# Patient Record
Sex: Female | Born: 1975 | Hispanic: Yes | Marital: Single | Smoking: Never smoker
Health system: Southern US, Community
[De-identification: ages and names within clinical notes are randomized; demographics above are authoritative.]

## PROBLEM LIST (undated history)

## (undated) ENCOUNTER — Inpatient Hospital Stay (HOSPITAL_COMMUNITY): Payer: Self-pay

## (undated) DIAGNOSIS — D649 Anemia, unspecified: Secondary | ICD-10-CM

## (undated) DIAGNOSIS — O09519 Supervision of elderly primigravida, unspecified trimester: Secondary | ICD-10-CM

## (undated) DIAGNOSIS — A749 Chlamydial infection, unspecified: Secondary | ICD-10-CM

## (undated) DIAGNOSIS — E669 Obesity, unspecified: Secondary | ICD-10-CM

## (undated) DIAGNOSIS — I8390 Asymptomatic varicose veins of unspecified lower extremity: Secondary | ICD-10-CM

## (undated) HISTORY — PX: NO PAST SURGERIES: SHX2092

---

## 2016-12-13 LAB — OB RESULTS CONSOLE HIV ANTIBODY (ROUTINE TESTING): HIV: NONREACTIVE

## 2016-12-13 LAB — OB RESULTS CONSOLE RPR: RPR: NONREACTIVE

## 2016-12-13 LAB — OB RESULTS CONSOLE ANTIBODY SCREEN: ANTIBODY SCREEN: NEGATIVE

## 2016-12-13 LAB — OB RESULTS CONSOLE ABO/RH: RH Type: POSITIVE

## 2016-12-13 LAB — OB RESULTS CONSOLE GBS: STREP GROUP B AG: POSITIVE

## 2016-12-13 LAB — OB RESULTS CONSOLE HEPATITIS B SURFACE ANTIGEN: HEP B S AG: NEGATIVE

## 2016-12-13 LAB — OB RESULTS CONSOLE GC/CHLAMYDIA
Chlamydia: POSITIVE
Gonorrhea: NEGATIVE

## 2016-12-13 LAB — OB RESULTS CONSOLE VARICELLA ZOSTER ANTIBODY, IGG: VARICELLA IGG: IMMUNE

## 2016-12-13 LAB — OB RESULTS CONSOLE RUBELLA ANTIBODY, IGM: RUBELLA: IMMUNE

## 2017-04-04 LAB — OB RESULTS CONSOLE GC/CHLAMYDIA
CHLAMYDIA, DNA PROBE: NEGATIVE
Gonorrhea: NEGATIVE

## 2017-04-05 ENCOUNTER — Encounter: Payer: Self-pay | Admitting: Obstetrics and Gynecology

## 2017-04-05 DIAGNOSIS — O329XX0 Maternal care for malpresentation of fetus, unspecified, not applicable or unspecified: Secondary | ICD-10-CM | POA: Insufficient documentation

## 2017-04-05 NOTE — Progress Notes (Signed)
[facility] Note  MD Consult Note Patient is 36/2 weeks and was breech yesterday on bpp. d/w her re: r/b/a, namely ECV and r/b of it. patient and partner undecided; they have an appt in two days and I said they can think about it and let us know. I told them the earliest an ECV would be done is at 37/0 weeks, if that's what they want. if not, then she will need a C-section scheduled for 39/0 weeks.

## 2017-04-18 ENCOUNTER — Encounter (HOSPITAL_COMMUNITY): Payer: Self-pay

## 2017-04-18 NOTE — Pre-Procedure Instructions (Signed)
Interpreter number [phone]

## 2017-04-21 NOTE — Patient Instructions (Signed)
Instrucciones Para Antes de la Ciruga   Su ciruga est programada para 04/24/2017  (your procedure is scheduled on) Entre por la entrada principal del [facility]  a las 0945 de la [place] -(enter through the main entrance at [facility] at  AM    ITT IndustriesLevante el telfono, [place] el [phone] para informarnos de su llegada. (pick up phone, dial [phone] on arrival)     Por favor llame al [phone] si tiene algn problema en la maana de la ciruga (please call this number if you have any problems the morning of surgery.)                  Recuerde: (Remember)  No coma alimentos. (Do not eat food (After Midnight) Desps de medianoche)    No tome lquidos claros. (Do not drink clear liquids (After Midnight) Desps de medianoche)    No use joyas, maquillaje de ojos, lpiz labial, crema para el cuerpo o esmalte de uas oscuro. (Do not wear jewelry, eye makeup, lipstick, body lotion, or dark fingernail polish). Puede usar desodorante (you may wear deodorant)    No se afeite 48 horas de su ciruga. (Do not shave 48 hours before your surgery)    No traiga objetos de valor al hospital.  Ames no se hace responsable de ninguna pertenencia, ni objetos de valor que haya trado al hospital. (Do not bring valuable to the hospital.  [facility] is not responsible for any belongings or valuables brought to the hospital)   [facility] estas medicinas en la maana de la ciruga con un SORBITO de agua nada (take these meds the morning of surgery with a SIP of water)     Durante la ciruga no se pueden usar lentes de contacto, dentaduras o puentes. (Contacts, dentures or bridgework cannot be worn in surgery).   Si va a ser ingresado despus de la ciruga, deje la AMR Corporationmaleta en el carro hasta que se le haya asignado una habitacin. (If you are to be admitted after surgery, leave suitcase in car until your room has been assigned.)   A los pacientes que se les d de alta el  mismo da no se les permitir manejar a casa.  (Patients discharged on the day of surgery will not be allowed to drive home)    French Guianaombre y nmero de telfono del Programmer, multimediaconductor na. (Name and telephone number of your driver)   Instrucciones especiales N/A (Special Instructions)   Por favor, lea las hojas informativas que le entregaron. (Please read over the following fact sheets that you were given) Surgical Site Infection Prevention

## 2017-04-22 ENCOUNTER — Encounter (HOSPITAL_COMMUNITY)
Admission: RE | Admit: 2017-04-22 | Discharge: 2017-04-22 | Disposition: A | Discharge status: Home / Self Care | Payer: Self-pay | Source: Ambulatory Visit | Attending: Obstetrics & Gynecology | Admitting: Obstetrics & Gynecology

## 2017-04-22 HISTORY — DX: Obesity, unspecified: E66.9

## 2017-04-22 HISTORY — DX: Anemia, unspecified: D64.9

## 2017-04-22 HISTORY — DX: Supervision of elderly primigravida, unspecified trimester: O09.519

## 2017-04-22 LAB — CBC
HCT: 34.8 % — ABNORMAL LOW (ref 36.0–46.0)
Hemoglobin: 11.4 g/dL — ABNORMAL LOW (ref 12.0–15.0)
MCH: 27.1 pg (ref 26.0–34.0)
MCHC: 32.8 g/dL (ref 30.0–36.0)
MCV: 82.7 fL (ref 78.0–100.0)
PLATELETS: 141 10*3/uL — AB (ref 150–400)
RBC: 4.21 MIL/uL (ref 3.87–5.11)
RDW: 14.3 % (ref 11.5–15.5)
WBC: 6.2 10*3/uL (ref 4.0–10.5)

## 2017-04-22 LAB — TYPE AND SCREEN
ABO/RH(D): A POS
Antibody Screen: NEGATIVE

## 2017-04-22 LAB — ABO/RH: ABO/RH(D): A POS

## 2017-04-23 LAB — RPR: RPR Ser Ql: NONREACTIVE

## 2017-04-24 ENCOUNTER — Inpatient Hospital Stay (HOSPITAL_COMMUNITY): Payer: Self-pay | Admitting: Anesthesiology

## 2017-04-24 ENCOUNTER — Encounter (HOSPITAL_COMMUNITY)
Admission: AD | Disposition: A | Discharge status: Home / Self Care | Payer: Self-pay | Source: Ambulatory Visit | Attending: Obstetrics & Gynecology

## 2017-04-24 ENCOUNTER — Encounter (HOSPITAL_COMMUNITY): Payer: Self-pay | Admitting: *Deleted

## 2017-04-24 ENCOUNTER — Observation Stay (HOSPITAL_COMMUNITY)
Admission: AD | Admit: 2017-04-24 | Discharge: 2017-04-24 | Disposition: A | Discharge status: Home / Self Care | Payer: Self-pay | Source: Ambulatory Visit | Attending: Obstetrics & Gynecology | Admitting: Obstetrics & Gynecology

## 2017-04-24 ENCOUNTER — Other Ambulatory Visit: Payer: Self-pay

## 2017-04-24 DIAGNOSIS — O329XX0 Maternal care for malpresentation of fetus, unspecified, not applicable or unspecified: Secondary | ICD-10-CM | POA: Diagnosis present

## 2017-04-24 DIAGNOSIS — Z0379 Encounter for other suspected maternal and fetal conditions ruled out: Principal | ICD-10-CM | POA: Insufficient documentation

## 2017-04-24 DIAGNOSIS — Z3A39 39 weeks gestation of pregnancy: Secondary | ICD-10-CM | POA: Insufficient documentation

## 2017-04-24 DIAGNOSIS — O99213 Obesity complicating pregnancy, third trimester: Secondary | ICD-10-CM | POA: Insufficient documentation

## 2017-04-24 SURGERY — Surgical Case
Anesthesia: Spinal

## 2017-04-24 MED ORDER — LACTATED RINGERS IV SOLN
INTRAVENOUS | Status: DC
  Administered 2017-04-24: 10:00:00 via INTRAVENOUS

## 2017-04-24 MED ORDER — SOD CITRATE-CITRIC ACID 500-334 MG/5ML PO SOLN: 30.0000 mL | ORAL | Status: DC

## 2017-04-24 MED ORDER — CEFAZOLIN SODIUM-DEXTROSE 2-4 GM/100ML-% IV SOLN: 2.0000 g | INTRAVENOUS | Status: DC

## 2017-04-24 NOTE — Discharge Instructions (Signed)
Evaluacin de los movimientos fetales Fetal Movement Counts Introduccin Nombre del paciente: ________________________________________________ Janet [name] estimada: ____________________ Janet [name] evaluacin de los movimientos fetales? Una evaluacin de los movimientos fetales es el registro del nmero de veces que siente que el beb se mueve durante un cierto perodo de [name]. Esto tambin se puede denominar recuento de patadas fetales. Una evaluacin de movimientos fetales se recomienda a todas las embarazadas. Es posible que le indiquen que comience a Development worker, community los movimientos fetales desde la semana 28 de [place]. Preste atencin cuando sienta que el beb est ms activo. Podr detectar los ciclos en que el beb duerme y est despierto. Tambin podr detectar que ciertas cosas hacen que su beb se mueva ms. Deber realizar una evaluacin de los movimientos fetales en las siguientes situaciones:  Cuando el beb est ms activo habitualmente.  A la [employer], todos los [place].  Un buen momento para evaluar los movimientos fetales es cuando est descansando, despus de haber comido y bebido algo. Cmo debo contar los movimientos fetales? 1. Encuentre un lugar tranquilo y cmodo. Sintese o acustese de lado. 2. Anote la fecha, la hora de inicio y de finalizacin y la cantidad de movimientos que sinti entre esas dos horas. Lleve esta informacin a las visitas de control. 3. Cuente las pataditas, revoloteos, chasquidos, vueltas o pinchazos en un perodo de 2horas. Debe sentir al menos en 2horas. 4. Cuando sienta , puede dejar de contar. 5. Si no siente en 2horas, coma y beba algo. Luego, contine descansando y Industrial/product designer. Si siente al menos durante esa hora, puede dejar de contar. Comunquese con un mdico si:  Siente menos de en 2horas.  El beb no se mueve tanto como suele hacerlo. Fecha:  ____________ Janet [name] inicio: ____________ Janet [name] finalizacin: ____________ Movimientos: ____________ Janet [name]: ____________ Janet [name] inicio: ____________ Janet [name] finalizacin: ____________ Movimientos: ____________ Janet [name]: ____________ Janet [name] inicio: ____________ Janet [name] finalizacin: ____________ Movimientos: ____________ Janet [name]: ____________ Janet [name] inicio: ____________ Janet [name] finalizacin: ____________ Movimientos: ____________ Janet [name]: ____________ Janet [name] inicio: ____________ Janet [name] de finalizacin: ____________ Movimientos: ____________ Janet [name]: ____________ Janet [name] inicio: ____________ Janet [name] de finalizacin: ____________ Movimientos: ____________ Janet [name]: ____________ Janet [name] inicio: ____________ Janet [name] de finalizacin: ____________ Movimientos: ____________ Janet [name]: ____________ Janet [name] inicio: ____________ Janet [name] finalizacin: ____________ Movimientos: ____________ Janet [name]: ____________ Janet [name] inicio: ____________ Janet [name] de finalizacin: ____________ Movimientos: ____________ Esta informacin no tiene como fin reemplazar el consejo del mdico. Asegrese de hacerle al mdico cualquier pregunta que tenga. Document Released: 05/18/2007 Document Revised: 05/14/2016 Document Reviewed: 03/20/2015 Elsevier Interactive Patient Education  2018 [employer]. Induccin del trabajo de parto (Labor Induction) Se denomina induccin del trabajo de parto cuando se inician acciones para hacer que una mujer embarazada comience el [place] de [place]. La Harley[employer]son de las mujeres comienzan el trabajo de parto sin ayuda entre las semanas 37 y 42 del Psychiatrist. Cuando esto no ocurre o cuando hay una necesidad mdica, pueden utilizarse diferentes mtodos para inducirlo. La induccin del trabajo de parto hace que el tero se contraiga. Tambin hace que el cuello del tero se ablandemadure), se abra (se dilate), y se afine (se borre). Generalmente el trabajo de parto no se induce antes de las 39 semanas excepto que haya un problema con el  beb o con la [place]. Antes de inducir el trabajo de parto, el mdico considerar cierto nmero de factores incluyendo los siguientes:  El estado del beb.  Cuntas semanas tiene de [place].  Theresa Duty  de los pulmones del beb.  El [place] del cuello del tero.  La posicin del beb. CULES SON LOS MOTIVOS PARA INDUCIR UN PARTO? El [place] de parto puede inducirse por las siguientes razones:  La salud del beb o de la madre estn en riesgo.  El embarazo se ha pasado de trmino en 1 semana o ms.  Ha roto la bolsa de aguas pero no se ha iniciado el trabajo de parto por s mismo.  La madre tiene algn trastorno de salud o una enfermedad grave, como hipertensin arterial, una infeccin, desprendimiento abrupto de la placenta o diabetes.  Hay escaso lquido amnitico alrededor del beb.  El beb presenta sufrimiento. La conveniencia o el deseo de que el beb nazca en una cierta fecha no es un motivo para inducir el [place]. CULES SON LOS MTODOS UTILIZADOS PARA INDUCIR EL TRABAJO DE PARTO? Algunos mtodos de induccin del [place]hor Dow son:  Administracin del medicamentos prostaglandina. Este medicamento hace que el cuello uterino se dilate y [place]. Este medicamento tambin iniciar las contracciones. Puede tomarse por boca o insertarse en la vagina en forma de supositorio.  Insercin en la vagina de un tubo delgado (catter) con un baln en el extremo para dilatar el cuello del tero. Una vez insertado, el baln se infla con agua, lo que provoca la apertura del cuello del tero.  Ruptura de las [place]. El mdico separa el saco amnitico del cuello uterino, haciendo que el cuello uterino se distienda y cause la liberacin de la hormona llamada progesterona. Esto hace que el tero se contraiga. Este procedimiento se realiza durante una visita al consultorio mdico. Le indicarn que vuelva a su casa y espere que se inicien las contracciones. Luego tendr que volver para la  induccin.  Ruptura de la bolsa de aguas. El mdico romper el saco amnitico con un pequeo instrumento. Una vez que el saco amnitico se rompe, las Therapist, occupational. Pueden pasar algunas horas hasta que [employer].  Medicamentos que desencadenen o intensifiquen las contracciones. Se lo administrarn a travs de un catter por va intravenosa (IV) que se inserta en una de las venas del brazo. Todos los mtodos de induccin, excepto la ruptura de [place], se Futures trader hospital. La induccin se Games developer hospital, de modo que usted y el beb puedan ser controlados cuidadosamente. CUNTO TIEMPO LLEVA INDUCIR EL TRABAJO DE PARTO? Algunas inducciones pueden demorar entre 2 y [address]. Generalmente lleva Sara Lee, dependiendo del [place] del cuello del tero. Puede tomar ms tiempo si la induccin se realiza en etapas tempranas del Psychiatrist o es su Tree surgeon. Si han pasado 2 o [address] y no se inicia el trabajo de [place], podrn enviarla a su casa o Magazine features editor cesrea. CULES SON LOS RIESGOS ASOCIADOS CON LA INDUCCiN DEL TRABAJO DE PARTO? Algunos de los riesgos de la induccin son:  Cambios en la frecuencia cardaca fetal, por ejemplo los latidos son demasiado rpidos, o lentos, o errticos.  Riesgo de distrs fetal.  Posibilidad de infeccin en la madre o el beb.  Aumento de la posibilidad de que sea necesaria una cesrea.  Ruptura (abrupcin) de la placenta del tero (raro).  Ruptura uterina (muy raro). Cuando es Passenger transport manager la induccin por razones mdicas, los beneficios deben superar a los [place]. CULES SON ALGUNAS RAZONES PARA NO INDUCIR EL TRABAJO DE PARTO? La induccin no debe realizarse si:  Se demuestra que el beb no tolera el trabajo de [place].  Fue sometida anteriormente  a cirugas en el tero, como una miomectoma o le han extirpado fibromas.  La placenta est en una posicin muy baja en el tero y obstruye la abertura del cuello  (placenta previa).  El beb no est ubicado con la Walgreencabeza hacia bajo.  El cordn umbilical cae hacia el canal de parto, adelante del beb. Esto puede cortar el suministro de [place] y oxgeno al beb.  Fue sometida a Higher education careers adviseruna cesrea anteriormente.  Hay circunstancias poco habituales, como que el beb es Doctor, general practiceextremadamente prematuro. Esta informacin no tiene Theme park managercomo fin reemplazar el consejo del mdico. Asegrese de hacerle al mdico cualquier pregunta que tenga. Document Released: 05/18/2007 Document Revised: 06/02/2015 Document Reviewed: 09/07/2012 Elsevier Interactive Patient Education  2017 Arvin[employer]vier [employer].

## 2017-04-24 NOTE — H&P (Addendum)
Obstetric Preoperative History and Physical  Tedra Senegallvira Alvara-Gonzalez is a 42 y.o. G1P0 with IUP at 6864w0d with Estimated Date of Delivery: 05/01/17 by 20-wk U/S presenting for presenting for scheduled cesarean section for fetal malpresentation (breech).  No acute concerns.   Prenatal Course Source of Care: [facility] Pregnancy complications or risks: Patient Active Problem List   Diagnosis Date Noted  . Malpresentation of fetus 04/05/2017   She plans to breastfeed  Prenatal labs and studies: ABO, Rh: --/--/A POS, A POS Performed at [facility], [address] [place], [place] [address]  [phone](03/01 1120) Antibody: NEG (03/01 1120) Rubella: Immune (10/22 0000) RPR: Non Reactive (03/01 1120)  HBsAg: Negative (10/22 0000)  HIV: Non-reactive (10/22 0000)  YQM:VHQIONGEGBS:Positive (10/22 0000) 1 hr Glucola  92 (1st trimester); 128 (3rd trimester) G/CChlamydia: positive in first trimester, neg TOC 02/07/17; negative in third trimester (05/02/17) Genetic screening: too late Anatomy US normal  Prenatal Transfer Tool  Maternal Diabetes: No Genetic Screening: not done (too late) Maternal Ultrasounds/Referrals: Normal Fetal Ultrasounds or other Referrals:  None Maternal Substance Abuse:  No Significant Maternal Medications:  None Significant Maternal Lab Results: Lab values include: Group B Strep positive  Past Medical History:  Diagnosis Date  . AMA (advanced maternal age) primigravida 35+   . Anemia   . Obesity     Past Surgical History:  Procedure Laterality Date  . NO PAST SURGERIES      OB History  Gravida Para Term Preterm AB Living  1            SAB TAB Ectopic Multiple Live Births               # Outcome Date GA Lbr Len/2nd Weight Sex Delivery Anes PTL Lv  1 Current               Social History   Socioeconomic History  . Marital status: Single    Spouse name: None  . Number of children: None  . Years of education: None  . Highest education level: None  Social Needs  .  Financial resource strain: None  . Food insecurity - worry: None  . Food insecurity - inability: None  . Transportation needs - medical: None  . Transportation needs - non-medical: None  Occupational History  . None  Tobacco Use  . Smoking status: Never Smoker  . Smokeless tobacco: Never Used  Substance and Sexual Activity  . Alcohol use: None  . Drug use: None  . Sexual activity: None  Other Topics Concern  . None  Social History Narrative  . None    Family History  Problem Relation Age of Onset  . Hypertension Mother     Medications Prior to Admission  Medication Sig Dispense Refill Last Dose  . Prenatal Vit-Fe Fumarate-FA (PRENATAL PO) Take 1 tablet by mouth daily.       No Known Allergies  Review of Systems: Negative except for what is mentioned in HPI.  Physical Exam: BP (!) 153/76   Pulse 85   Temp 98.2 F (36.8 C) (Oral)   Resp 18   Ht 5' 4.96" (1.65 m)   Wt 261 lb 14.4 oz (118.8 kg)   BMI 43.63 kg/m   CONSTITUTIONAL: Well-developed, well-nourished female in no acute distress.  HENT:  Normocephalic, atraumatic. Oropharynx is clear and moist EYES: Conjunctivae and EOM are normal. No scleral icterus.  NECK: Normal range of motion, supple SKIN: Skin is warm and dry. No rash noted. Not diaphoretic. No erythema.  NEUROLGIC: Alert and oriented to person, place, and time. Normal reflexes, muscle tone coordination. No cranial nerve deficit noted. PSYCHIATRIC: Normal mood and affect. Normal behavior. CARDIOVASCULAR: Normal heart rate noted, regular rhythm RESPIRATORY: Effort and breath sounds normal, no problems with respiration noted ABDOMEN: Soft, nontender, nondistended, gravid.  PELVIC: SVE closed/thick/high, posterior MUSCULOSKELETAL: Normal range of motion. No edema and no tenderness. 2+ distal pulses.  Bedside U/S: cephalic  FHT: baseline rate 130, moderate variability, +acel, no decel  Pertinent Labs/Studies:   Results for orders placed or performed  during the hospital encounter of 04/22/17 (from the past 72 hour(s))  CBC     Status: Abnormal   Collection Time: 04/22/17 11:20 AM  Result Value Ref Range   WBC 6.2 4.0 - 10.5 K/uL   RBC 4.21 3.87 - 5.11 MIL/uL   Hemoglobin 11.4 (L) 12.0 - 15.0 g/dL   HCT 69.6 (L) 29.5 - 28.4 %   MCV 82.7 78.0 - 100.0 fL   MCH 27.1 26.0 - 34.0 pg   MCHC 32.8 30.0 - 36.0 g/dL   RDW 13.2 44.0 - 10.2 %   Platelets 141 (L) 150 - 400 K/uL    Comment: Performed at [facility], [address] [place], [place] [address]  Type and screen [facility]     Status: None   Collection Time: 04/22/17 11:20 AM  Result Value Ref Range   ABO/RH(D) A POS    Antibody Screen NEG    Sample Expiration      04/25/2017 Performed at [facility], [address], [place], [place] [address]   RPR     Status: None   Collection Time: 04/22/17 11:20 AM  Result Value Ref Range   RPR Ser Ql Non Reactive Non Reactive    Comment: (NOTE) Performed At: [facility] [address] [place], [place] [id number] Jolene Schimke MD OV:[id number] Performed at [facility], [address] [place], [place] [address]   ABO/Rh     Status: None   Collection Time: 04/22/17 11:20 AM  Result Value Ref Range   ABO/RH(D)      A POS Performed at [facility], [address], [place], [place] [address]     Assessment and Plan :Latrisa Hellums is a 42 y.o. G1P0 at [redacted]w[redacted]d here for scheduled cesarean section for breech presentation, but presentation today is cephalic. Has reactive NST. Initial BP was elevated, but normal on recheck.  Patient was informed of no longer needing C/S, and was offered option of awaiting onset of labor vs IOL today. SVE as above, unfavorable. Pt decided on going home and awaiting onset of labor. She understands that she may still need IOL for she does not have SOL.   - D/c home in stable condition - Counseled on labor and ROM precautions - Counseled on fetal kick  counts - F/u PNV at HD later this week (pt advised to schedule visit and note forwarded to [facility]).   Raynelle Fanning P. Fidencio Duddy, MD OB Fellow Faculty Practice, [facility]

## 2017-04-24 NOTE — Anesthesia Preprocedure Evaluation (Signed)
Anesthesia Evaluation  Patient identified by MRN, date of birth, ID band Patient awake    Reviewed: Allergy & Precautions, NPO status , Patient's Chart, lab work & pertinent test results  Airway Mallampati: II  TM Distance: >3 FB Neck ROM: Full    Dental no notable dental hx.    Pulmonary neg pulmonary ROS,    Pulmonary exam normal breath sounds clear to auscultation       Cardiovascular negative cardio ROS Normal cardiovascular exam Rhythm:Regular Rate:Normal     Neuro/Psych negative neurological ROS  negative psych ROS   GI/Hepatic negative GI ROS, Neg liver ROS,   Endo/Other  Morbid obesity  Renal/GU negative Renal ROS     Musculoskeletal negative musculoskeletal ROS (+)   Abdominal (+) + obese,   Peds  Hematology  (+) anemia ,   Anesthesia Other Findings   Reproductive/Obstetrics (+) Pregnancy                             Anesthesia Physical Anesthesia Plan  ASA: III  Anesthesia Plan: Spinal   Post-op Pain Management:    Induction:   PONV Risk Score and Plan: 4 or greater and Ondansetron, Scopolamine patch - Pre-op and Treatment may vary due to age or medical condition  Airway Management Planned:   Additional Equipment:   Intra-op Plan:   Post-operative Plan:   Informed Consent: I have reviewed the patients History and Physical, chart, labs and discussed the procedure including the risks, benefits and alternatives for the proposed anesthesia with the patient or authorized representative who has indicated his/her understanding and acceptance.   Dental advisory given  Plan Discussed with: CRNA  Anesthesia Plan Comments:         Anesthesia Quick Evaluation

## 2017-04-26 ENCOUNTER — Other Ambulatory Visit: Payer: Self-pay | Admitting: Advanced Practice Midwife

## 2017-05-01 ENCOUNTER — Inpatient Hospital Stay (HOSPITAL_COMMUNITY)
Admission: RE | Admit: 2017-05-01 | Discharge: 2017-05-05 | DRG: 788 | Disposition: A | Discharge status: Home / Self Care | Payer: Medicaid Other | Source: Ambulatory Visit | Attending: Obstetrics and Gynecology | Admitting: Obstetrics and Gynecology

## 2017-05-01 ENCOUNTER — Encounter (HOSPITAL_COMMUNITY): Payer: Self-pay

## 2017-05-01 ENCOUNTER — Other Ambulatory Visit: Payer: Self-pay

## 2017-05-01 ENCOUNTER — Inpatient Hospital Stay (HOSPITAL_COMMUNITY): Payer: Medicaid Other

## 2017-05-01 DIAGNOSIS — D649 Anemia, unspecified: Secondary | ICD-10-CM | POA: Diagnosis present

## 2017-05-01 DIAGNOSIS — O3660X0 Maternal care for excessive fetal growth, unspecified trimester, not applicable or unspecified: Secondary | ICD-10-CM

## 2017-05-01 DIAGNOSIS — O9902 Anemia complicating childbirth: Secondary | ICD-10-CM | POA: Diagnosis present

## 2017-05-01 DIAGNOSIS — O99824 Streptococcus B carrier state complicating childbirth: Secondary | ICD-10-CM | POA: Diagnosis present

## 2017-05-01 DIAGNOSIS — Z3A40 40 weeks gestation of pregnancy: Secondary | ICD-10-CM | POA: Diagnosis not present

## 2017-05-01 DIAGNOSIS — Z3490 Encounter for supervision of normal pregnancy, unspecified, unspecified trimester: Secondary | ICD-10-CM

## 2017-05-01 DIAGNOSIS — O26893 Other specified pregnancy related conditions, third trimester: Secondary | ICD-10-CM | POA: Diagnosis present

## 2017-05-01 DIAGNOSIS — O09513 Supervision of elderly primigravida, third trimester: Secondary | ICD-10-CM | POA: Diagnosis present

## 2017-05-01 DIAGNOSIS — O99214 Obesity complicating childbirth: Secondary | ICD-10-CM | POA: Diagnosis present

## 2017-05-01 DIAGNOSIS — O3663X0 Maternal care for excessive fetal growth, third trimester, not applicable or unspecified: Principal | ICD-10-CM | POA: Diagnosis present

## 2017-05-01 DIAGNOSIS — O480 Post-term pregnancy: Secondary | ICD-10-CM | POA: Diagnosis not present

## 2017-05-01 HISTORY — DX: Chlamydial infection, unspecified: A74.9

## 2017-05-01 HISTORY — DX: Asymptomatic varicose veins of unspecified lower extremity: I83.90

## 2017-05-01 LAB — COMPREHENSIVE METABOLIC PANEL
ALBUMIN: 2.9 g/dL — AB (ref 3.5–5.0)
ALT: 11 U/L — ABNORMAL LOW (ref 14–54)
ANION GAP: 9 (ref 5–15)
AST: 18 U/L (ref 15–41)
Alkaline Phosphatase: 118 U/L (ref 38–126)
BILIRUBIN TOTAL: 0.6 mg/dL (ref 0.3–1.2)
BUN: 8 mg/dL (ref 6–20)
CO2: 19 mmol/L — ABNORMAL LOW (ref 22–32)
Calcium: 8.4 mg/dL — ABNORMAL LOW (ref 8.9–10.3)
Chloride: 106 mmol/L (ref 101–111)
Creatinine, Ser: 0.47 mg/dL (ref 0.44–1.00)
GFR calc non Af Amer: >60 mL/min (ref >60)
GLUCOSE: 119 mg/dL — AB (ref 65–99)
POTASSIUM: 4 mmol/L (ref 3.5–5.1)
Sodium: 134 mmol/L — ABNORMAL LOW (ref 135–145)
TOTAL PROTEIN: 6.2 g/dL — AB (ref 6.5–8.1)

## 2017-05-01 LAB — PROTEIN / CREATININE RATIO, URINE
CREATININE, URINE: 163 mg/dL
PROTEIN CREATININE RATIO: 0.12 mg/mg{creat} (ref 0.00–0.15)
TOTAL PROTEIN, URINE: 19 mg/dL

## 2017-05-01 LAB — CBC
HCT: 34 % — ABNORMAL LOW (ref 36.0–46.0)
HEMATOCRIT: 34.4 % — AB (ref 36.0–46.0)
Hemoglobin: 11.5 g/dL — ABNORMAL LOW (ref 12.0–15.0)
Hemoglobin: 11.4 g/dL — ABNORMAL LOW (ref 12.0–15.0)
MCH: 27.6 pg (ref 26.0–34.0)
MCH: 27.1 pg (ref 26.0–34.0)
MCHC: 33.8 g/dL (ref 30.0–36.0)
MCHC: 33.1 g/dL (ref 30.0–36.0)
MCV: 81.5 fL (ref 78.0–100.0)
MCV: 81.9 fL (ref 78.0–100.0)
PLATELETS: 165 10*3/uL (ref 150–400)
Platelets: 153 10*3/uL (ref 150–400)
RBC: 4.17 MIL/uL (ref 3.87–5.11)
RBC: 4.2 MIL/uL (ref 3.87–5.11)
RDW: 14.2 % (ref 11.5–15.5)
RDW: 14.2 % (ref 11.5–15.5)
WBC: 5.5 10*3/uL (ref 4.0–10.5)
WBC: 7.8 10*3/uL (ref 4.0–10.5)

## 2017-05-01 LAB — TYPE AND SCREEN
ABO/RH(D): A POS
Antibody Screen: NEGATIVE

## 2017-05-01 MED ORDER — SODIUM CHLORIDE 0.9 % IV SOLN
5.0000 10*6.[IU] | Freq: Once | INTRAVENOUS | Status: AC
  Administered 2017-05-01: 5 10*6.[IU] via INTRAVENOUS
  Filled 2017-05-01: qty 5

## 2017-05-01 MED ORDER — ACETAMINOPHEN 325 MG PO TABS: 650.0000 mg | ORAL_TABLET | ORAL | Status: DC | PRN

## 2017-05-01 MED ORDER — OXYTOCIN BOLUS FROM INFUSION: 500.0000 mL | Freq: Once | INTRAVENOUS | Status: DC

## 2017-05-01 MED ORDER — OXYCODONE-ACETAMINOPHEN 5-325 MG PO TABS: 1.0000 | ORAL_TABLET | ORAL | Status: DC | PRN

## 2017-05-01 MED ORDER — OXYTOCIN 40 UNITS IN LACTATED RINGERS INFUSION - SIMPLE MED
2.5000 [IU]/h | INTRAVENOUS | Status: DC
  Filled 2017-05-01: qty 1000

## 2017-05-01 MED ORDER — LACTATED RINGERS IV SOLN
INTRAVENOUS | Status: DC
  Administered 2017-05-01 – 2017-05-02 (×5): via INTRAVENOUS

## 2017-05-01 MED ORDER — PENICILLIN G POT IN DEXTROSE 60000 UNIT/ML IV SOLN
3.0000 10*6.[IU] | INTRAVENOUS | Status: DC
  Administered 2017-05-01 – 2017-05-02 (×9): 3 10*6.[IU] via INTRAVENOUS
  Filled 2017-05-01 (×13): qty 50

## 2017-05-01 MED ORDER — FENTANYL CITRATE (PF) 100 MCG/2ML IJ SOLN
100.0000 ug | INTRAMUSCULAR | Status: DC | PRN
  Administered 2017-05-01 (×4): 100 ug via INTRAVENOUS
  Filled 2017-05-01 (×4): qty 2

## 2017-05-01 MED ORDER — OXYCODONE-ACETAMINOPHEN 5-325 MG PO TABS: 2.0000 | ORAL_TABLET | ORAL | Status: DC | PRN

## 2017-05-01 MED ORDER — ONDANSETRON HCL 4 MG/2ML IJ SOLN
4.0000 mg | Freq: Four times a day (QID) | INTRAMUSCULAR | Status: DC | PRN
  Administered 2017-05-01: 4 mg via INTRAVENOUS
  Filled 2017-05-01: qty 2

## 2017-05-01 MED ORDER — LIDOCAINE HCL (PF) 1 % IJ SOLN
30.0000 mL | INTRAMUSCULAR | Status: DC | PRN
  Filled 2017-05-01: qty 30

## 2017-05-01 MED ORDER — MISOPROSTOL 50MCG HALF TABLET
50.0000 ug | ORAL_TABLET | ORAL | Status: DC | PRN
  Administered 2017-05-01 (×4): 50 ug via BUCCAL
  Filled 2017-05-01 (×4): qty 1

## 2017-05-01 MED ORDER — SOD CITRATE-CITRIC ACID 500-334 MG/5ML PO SOLN: 30.0000 mL | ORAL | Status: DC | PRN

## 2017-05-01 MED ORDER — LACTATED RINGERS IV SOLN
500.0000 mL | INTRAVENOUS | Status: DC | PRN
  Administered 2017-05-02 (×5): 500 mL via INTRAVENOUS

## 2017-05-01 MED ORDER — TERBUTALINE SULFATE 1 MG/ML IJ SOLN: 0.2500 mg | Freq: Once | INTRAMUSCULAR | Status: DC | PRN

## 2017-05-01 NOTE — Anesthesia Pain Management Evaluation Note (Signed)
  [facility] Pain Management Visit Note  Patient: Janet [name], 42 y.o., female  "Hello I am a member of the anesthesia team at [facility]. We have an anesthesia team available at all times to provide care throughout the hospital, including epidural management and anesthesia for C-section. I don't know your plan for the delivery whether it a natural birth, water birth, IV sedation, nitrous supplementation, doula or epidural, but we want to meet your pain goals."   1.Was your pain managed to your expectations on prior hospitalizations?   No prior hospitalizations  2.What is your expectation for pain management during this hospitalization?     IV pain meds  3.How can we help you reach that goal? Nursing intervinetions  Record the patient's initial score and the patient's pain goal.   Pain: 2  Pain Goal: 10 [facility] wants you to be able to say your pain was always managed very well.  Tijuan Dantes 05/01/2017

## 2017-05-01 NOTE — Progress Notes (Signed)
Janet [name] is a 42 y.o. G1P0 at 7731w0d by ultrasound admitted for induction of labor due to [facility].  Subjective:   Objective: BP (!) 148/103   Pulse (!) 51   Temp 97.6 F (36.4 C) (Oral)   Resp 16   Ht 5\' 4"  (1.626 m)   Wt 264 lb 3.2 oz (119.8 kg)   BMI 45.35 kg/m  No intake/output data recorded. Total I/O In: 375 [I.V.:125; IV Piggyback:250] Out: 0   FHT:  FHR: 125-130 bpm, variability: moderate,  accelerations:  Present,  decelerations:  Absent UC:   irregular, every 5-8 minutes and mild SVE:   Dilation: Closed Effacement (%): Thick Exam by:: D Siah Steely  Labs: Lab Results  Component Value Date   WBC 5.5 05/01/2017   HGB 11.5 (L) 05/01/2017   HCT 34.0 (L) 05/01/2017   MCV 81.5 05/01/2017   PLT 165 05/01/2017    Assessment / Plan: yet to be in labor  Labor: yet to be in labor Preeclampsia:  no signs or symptoms of toxicity Fetal Wellbeing:  Category I Pain Control:  Labor support without medications I/D:  n/a Anticipated MOD:  NSVD  Janet [name] 05/01/2017, 6:07 PM

## 2017-05-01 NOTE — Progress Notes (Signed)
Janet [name] is a 42 y.o. G1P0 at 4650w0d by ultrasound admitted for induction of labor due to [facility].  Subjective:   Objective: BP 137/78   Pulse (!) 57   Temp 97.6 F (36.4 C) (Oral)   Resp 18   Ht 5\' 4"  (1.626 m)   Wt 264 lb 3.2 oz (119.8 kg)   BMI 45.35 kg/m  No intake/output data recorded. Total I/O In: 375 [I.V.:125; IV Piggyback:250] Out: 0   FHT:120's with accel no decels, uc's 2-4 and mild UC:   Mild 2-4 SVE:   Dilation: Closed Effacement (%): Thick Exam by:: Marcelino DusterMichelle, RN   Labs: Lab Results  Component Value Date   WBC 5.5 05/01/2017   HGB 11.5 (L) 05/01/2017   HCT 34.0 (L) 05/01/2017   MCV 81.5 05/01/2017   PLT 165 05/01/2017    Assessment / Plan: Induction of labor due to AMA,  progressing well on pitocin  Labor: Progressing normally and yet to be in labor Preeclampsia:  no signs or symptoms of toxicity Fetal Wellbeing:  Category I Pain Control:  Labor support without medications I/D:  n/a Anticipated MOD:  NSVD  Wyvonnia DuskyMarie Tyress Loden 05/01/2017, 1:50 PM

## 2017-05-01 NOTE — H&P (Signed)
Janet [name] is a 42 y.o. female G1 @ 40 wks presenting for IOL for AMA. OB History    Gravida Para Term Preterm AB Living   1             SAB TAB Ectopic Multiple Live Births                 Past Medical History:  Diagnosis Date  . AMA (advanced maternal age) primigravida 35+   . Anemia   . Obesity    Past Surgical History:  Procedure Laterality Date  . NO PAST SURGERIES     Family History: family history includes Hypertension in her mother. Social History:  reports that  has never smoked. she has never used smokeless tobacco. She reports that she does not drink alcohol or use drugs.     Maternal Diabetes: No Genetic Screening: Normal Maternal Ultrasounds/Referrals: Normal Fetal Ultrasounds or other Referrals:  Other:  Maternal Substance Abuse:  No Significant Maternal Medications:  None Significant Maternal Lab Results:  Lab values include: Other:  Other Comments:  treated for pos chlamydia with neg TOC. Pos GBS  Review of Systems  Constitutional: Negative.   HENT: Negative.   Eyes: Negative.   Respiratory: Negative.   Cardiovascular: Negative.   Gastrointestinal: Negative.   Genitourinary: Negative.   Musculoskeletal: Negative.   Skin: Negative.   Neurological: Negative.   Endo/Heme/Allergies: Negative.   Psychiatric/Behavioral: Negative.    Maternal Medical History:  Reason for admission: IOL @ 40 wks, AMA  Contractions: none  Fetal activity: Perceived fetal activity is normal.   Last perceived fetal movement was within the past hour.    Prenatal complications: Infection.   Prenatal Complications - Diabetes: none.    Dilation: Closed Exam by:: d lawson, CNM  Blood pressure 125/76, pulse 77, temperature 98.3 F (36.8 C), temperature source Oral, resp. rate 18, height 5\' 4"  (1.626 m), weight 264 lb 3.2 oz (119.8 kg). Maternal Exam:  Uterine Assessment: none  Abdomen: Patient reports no abdominal tenderness. Fetal presentation:  vertex  Introitus: Normal vulva. Normal vagina.  Ferning test: not done.  Nitrazine test: not done.  Pelvis: of concern for delivery.   Feels like large baby will get EFW Cervix: Cervix evaluated by digital exam.     Fetal Exam Fetal Monitor Review: Mode: ultrasound.   Variability: moderate (6-25 bpm).   Pattern: accelerations present.    Fetal State Assessment: Category I - tracings are normal.     Physical Exam  Constitutional: She is oriented to person, place, and time. She appears well-developed and well-nourished.  HENT:  Head: Normocephalic.  Neck: Normal range of motion.  Cardiovascular: Normal rate, regular rhythm, normal heart sounds and intact distal pulses.  Respiratory: Effort normal and breath sounds normal.  GI: Soft. Bowel sounds are normal.  Genitourinary: Vagina normal.  Musculoskeletal: Normal range of motion.  Neurological: She is alert and oriented to person, place, and time. She has normal reflexes.  Skin: Skin is warm and dry.  Psychiatric: She has a normal mood and affect. Her behavior is normal. Judgment and thought content normal.    Prenatal labs: ABO, Rh: --/--/A POS, A POS Performed at [facility], [address], [place], [place] [address]  [phone]) Antibody: NEG (03/01 1120) Rubella: Immune (10/22 0000) RPR: Non Reactive (03/01 1120)  HBsAg: Negative (10/22 0000)  HIV: Non-reactive (10/22 0000)  GBS: Positive (10/22 0000)   Assessment/Plan: FHR pattern reassurring GBS pos Suspect LGA SVE fim/cl/post/high vertex comfirmed by  u/s Plan: us for EFW cytotec induction of labor   Wyvonnia DuskyMarie Lawson 05/01/2017, 9:03 AM

## 2017-05-02 ENCOUNTER — Inpatient Hospital Stay (HOSPITAL_COMMUNITY): Payer: Medicaid Other | Admitting: Anesthesiology

## 2017-05-02 LAB — RPR: RPR Ser Ql: NONREACTIVE

## 2017-05-02 MED ORDER — LACTATED RINGERS IV SOLN: 500.0000 mL | Freq: Once | INTRAVENOUS | Status: DC

## 2017-05-02 MED ORDER — LACTATED RINGERS IV SOLN
INTRAVENOUS | Status: DC
  Administered 2017-05-02: 300 mL via INTRAUTERINE
  Administered 2017-05-02: 19:00:00 via INTRAUTERINE

## 2017-05-02 MED ORDER — FENTANYL 2.5 MCG/ML BUPIVACAINE 1/10 % EPIDURAL INFUSION (WH - ANES)
INTRAMUSCULAR | Status: AC
  Filled 2017-05-02: qty 100

## 2017-05-02 MED ORDER — DIPHENHYDRAMINE HCL 50 MG/ML IJ SOLN: 12.5000 mg | INTRAMUSCULAR | Status: DC | PRN

## 2017-05-02 MED ORDER — EPHEDRINE 5 MG/ML INJ
10.0000 mg | INTRAVENOUS | Status: DC | PRN
  Administered 2017-05-02: 10 mg via INTRAVENOUS

## 2017-05-02 MED ORDER — PHENYLEPHRINE 40 MCG/ML (10ML) SYRINGE FOR IV PUSH (FOR BLOOD PRESSURE SUPPORT): 80.0000 ug | PREFILLED_SYRINGE | INTRAVENOUS | Status: DC | PRN

## 2017-05-02 MED ORDER — OXYTOCIN 40 UNITS IN LACTATED RINGERS INFUSION - SIMPLE MED
1.0000 m[IU]/min | INTRAVENOUS | Status: DC
  Administered 2017-05-02: 8 m[IU]/min via INTRAVENOUS
  Administered 2017-05-02: 2 m[IU]/min via INTRAVENOUS

## 2017-05-02 MED ORDER — FENTANYL 2.5 MCG/ML BUPIVACAINE 1/10 % EPIDURAL INFUSION (WH - ANES)
14.0000 mL/h | INTRAMUSCULAR | Status: DC | PRN
  Administered 2017-05-02 (×2): 14 mL/h via EPIDURAL
  Filled 2017-05-02 (×2): qty 100

## 2017-05-02 MED ORDER — PHENYLEPHRINE 40 MCG/ML (10ML) SYRINGE FOR IV PUSH (FOR BLOOD PRESSURE SUPPORT)
PREFILLED_SYRINGE | INTRAVENOUS | Status: AC
  Administered 2017-05-02: 400 ug
  Filled 2017-05-02: qty 20

## 2017-05-02 MED ORDER — PHENYLEPHRINE 40 MCG/ML (10ML) SYRINGE FOR IV PUSH (FOR BLOOD PRESSURE SUPPORT)
PREFILLED_SYRINGE | INTRAVENOUS | Status: AC
  Filled 2017-05-02: qty 10

## 2017-05-02 MED ORDER — EPHEDRINE 5 MG/ML INJ
10.0000 mg | INTRAVENOUS | Status: DC | PRN
  Administered 2017-05-02: 10 mg via INTRAVENOUS
  Filled 2017-05-02: qty 4

## 2017-05-02 MED ORDER — TERBUTALINE SULFATE 1 MG/ML IJ SOLN: 0.2500 mg | Freq: Once | INTRAMUSCULAR | Status: DC | PRN

## 2017-05-02 MED ORDER — LIDOCAINE HCL (PF) 1 % IJ SOLN
INTRAMUSCULAR | Status: DC | PRN
  Administered 2017-05-02 (×2): 5 mL via EPIDURAL

## 2017-05-02 MED ORDER — FENTANYL 2.5 MCG/ML BUPIVACAINE 1/10 % EPIDURAL INFUSION (WH - ANES)
INTRAMUSCULAR | Status: DC | PRN
  Administered 2017-05-02: 14 mL/h via EPIDURAL

## 2017-05-02 MED ORDER — PHENYLEPHRINE 40 MCG/ML (10ML) SYRINGE FOR IV PUSH (FOR BLOOD PRESSURE SUPPORT)
80.0000 ug | PREFILLED_SYRINGE | INTRAVENOUS | Status: AC | PRN
  Administered 2017-05-02 (×3): 80 ug via INTRAVENOUS

## 2017-05-02 NOTE — Progress Notes (Signed)
Eda, in-house spanish interpreter, at bedside.  Patient and support person updated on plan of care, FHR changes and medications given.  Pt denies symptoms of low BP.  All questions answered.

## 2017-05-02 NOTE — Progress Notes (Signed)
In-house spanish interpreter at bedside.  Amnioinfusion, FHR changes and plan of care discussed with patient and support person. All questions answered.

## 2017-05-02 NOTE — Progress Notes (Signed)
Janet [name] is a 42 y.o. G1P0 at 2370w1d by ultrasound admitted for [facility]  Subjective:  Pt states she is comfortable with epidural.  Objective: BP (!) 112/58   Pulse 60   Temp 98.8 F (37.1 C) (Oral)   Resp 18   Ht 5\' 4"  (1.626 m)   Wt 119.8 kg (264 lb 3.2 oz)   SpO2 100%   BMI 45.35 kg/m  I/O last 3 completed shifts: In: 375 [I.V.:125; IV Piggyback:250] Out: 0  No intake/output data recorded.  FHT:  FHR: 130 bpm, variability: moderate,  accelerations:  Present,  decelerations:  Present recurrent late decelerations UC:   regular, every 3-5 minutes SVE:   Dilation: 4.5 Effacement (%): 50 Station: -2 Exam by:: Dr. Doroteo GlassmanPhelps  Labs: Lab Results  Component Value Date   WBC 7.8 05/01/2017   HGB 11.4 (L) 05/01/2017   HCT 34.4 (L) 05/01/2017   MCV 81.9 05/01/2017   PLT 153 05/01/2017    Assessment / Plan: G1P0 @ 4070w1d Induction of labor due to AMA, on of pitocin  Labor: Will titrate pitocin once cat 1 tracing achieved Preeclampsia:  n/a Fetal Wellbeing:  Category II and recurrent late deceleration; 10 of ephedrine and IVF bolus given Pain Control:  Epidural I/D:  GBS pos-PCN Anticipated MOD:  NSVD  Janet [name] 05/02/2017, 9:08 AM

## 2017-05-02 NOTE — Progress Notes (Signed)
Janet [name] is a 42 y.o. G1P0 at 2439w0d by ultrasound [facility]  Subjective: Uncomfortable with contractions. Frequent urination  Objective: BP 133/65   Pulse 62   Temp 98.6 F (37 C) (Oral)   Resp 20   Ht 5\' 4"  (1.626 m)   Wt 119.8 kg (264 lb 3.2 oz)   BMI 45.35 kg/m  I/O last 3 completed shifts: In: 375 [I.V.:125; IV Piggyback:250] Out: 0  No intake/output data recorded.  FHT:  FHR: 140 bpm, variability: moderate,  accelerations:  Present,  decelerations:  Present Early UC:   irregular, every 5-8 minutes and mild SVE:   Dilation: 4 Effacement (%): 50 Station: -2 Exam by:: Dr.Rumball  Labs: Lab Results  Component Value Date   WBC 7.8 05/01/2017   HGB 11.4 (L) 05/01/2017   HCT 34.4 (L) 05/01/2017   MCV 81.9 05/01/2017   PLT 153 05/01/2017    Assessment / Plan: In[facility] of [facility] Latent labor  Labor: Progressing normally, s/p cytotec and FB. FB recently came out and will not start Pitocin Preeclampsia:  no signs or symptoms of toxicity Fetal Wellbeing:  Category II Pain Control:  Labor support without medications, plan for epidural Anticipated MOD:  NSVD  Janet AdaJazma Phelps, DO 05/02/2017, 2:20 AM

## 2017-05-02 NOTE — Progress Notes (Signed)
Eda, in-house interpreter, at bedside. Pt and support person updated on labor progress.  No questions at this time.

## 2017-05-02 NOTE — Progress Notes (Signed)
Eda in-house interpreter at bedside. Patient and support person updated on FHR changes and internal monitoring.  All questions answered at this time.

## 2017-05-02 NOTE — Progress Notes (Signed)
Labor Progress Note Janet [name] is a 42 y.o. G1P0 at 6638w1d presented for IOL for AMA.  S: Patient comfortable with epidural.  O:  BP 130/63   Pulse 80   Temp 98.6 F (37 C) (Oral)   Resp 16   Ht 5\' 4"  (1.626 m)   Wt 264 lb 3.2 oz (119.8 kg)   SpO2 100%   BMI 45.35 kg/m   JYN:WGNFAOZHFHT:baseline rate 120, moderate variability, + acels, few late decels Toco: ctx every 4 min  CVE: Dilation: 4.5 Effacement (%): 50 Cervical Position: Middle Station: -2 Presentation: Vertex Exam by:: Dr.Rumball  A&P: 42 y.o. G1P0 7138w1d here for IOL for AMA. Labor: Not much cervical change. Will start Pitocin. Pain: Epidural FWB: Cat II GBS positive, PCN PreE: latest BP wnl, asymptomatic  Ellwood DenseAlison Rumball, DO 7:04 AM

## 2017-05-02 NOTE — Anesthesia Procedure Notes (Signed)
Epidural Patient location during procedure: OB Start time: 05/02/2017 2:58 AM  Staffing Anesthesiologist: Leonides GrillsEllender, Baylin Gamblin P, MD Performed: anesthesiologist   Preanesthetic Checklist Completed: patient identified, site marked, pre-op evaluation, timeout performed, IV checked, risks and benefits discussed and monitors and equipment checked  Epidural Patient position: sitting Prep: DuraPrep Patient monitoring: heart rate, cardiac monitor, continuous pulse ox and blood pressure Approach: midline Location: L3-L4 Injection technique: LOR air  Needle:  Needle type: Tuohy  Needle gauge: 17 G Needle length: 9 cm Needle insertion depth: 7 cm Catheter type: closed end flexible Catheter size: 19 Gauge Catheter at skin depth: 12 cm Test dose: negative and Other  Assessment Events: blood not aspirated, injection not painful, no injection resistance and negative IV test  Additional Notes Informed consent obtained prior to proceeding including risk of failure, 1% risk of PDPH, risk of minor discomfort and bruising. Discussed alternatives to epidural analgesia and patient desires to proceed.  Timeout performed pre-procedure verifying patient name, procedure, and platelet count.  Patient tolerated procedure well. Reason for block:procedure for pain

## 2017-05-02 NOTE — Progress Notes (Signed)
LABOR PROGRESS NOTE  Janet [name] is a 42 y.o. G1P0 at 6452w1d  admitted for IOL for AMA  Subjective: Patient comfortable with epidural. Not feeling pressure in bottom.   Objective: BP (!) 141/75   Pulse 68   Temp (P) 99.2 F (37.3 C) (Oral)   Resp 16   Ht 5\' 4"  (1.626 m)   Wt 264 lb 3.2 oz (119.8 kg)   SpO2 97%   BMI 45.35 kg/m  or  Vitals:   05/02/17 2000 05/02/17 2030 05/02/17 2100 05/02/17 2130  BP: 119/73 121/65 (!) 141/75   Pulse: 66 68 68   Resp: 16     Temp:    (P) 99.2 F (37.3 C)  TempSrc:    (P) Oral  SpO2: 97%     Weight:      Height:        Dilation: Lip/rim Effacement (%): 90 Cervical Position: Anterior Station: -1, 0 Presentation: Vertex Exam by:: Janet [name], CNM FHT: baseline rate 140, moderate varibility, +acel, variable and late decel Toco: 2-3  IUPC: 240 MVU- adequate   Labs: Lab Results  Component Value Date   WBC 7.8 05/01/2017   HGB 11.4 (L) 05/01/2017   HCT 34.4 (L) 05/01/2017   MCV 81.9 05/01/2017   PLT 153 05/01/2017    Patient Active Problem List   Diagnosis Date Noted  . Advanced maternal age, primigravida in third trimester, antepartum 05/01/2017  . Malpresentation of fetus 04/05/2017    Assessment / Plan: 42 y.o. G1P0 at 3852w1d here for IOL for AMA  Labor: Progressing well. Continue pitocin at current dose. Recheck progress in 2 hours.   Fetal Wellbeing:  Cat 2 Pain Control:  Epidural  Anticipated MOD:  SVD  Sharyon CableRogers, Tylea Hise C, CNM 05/02/2017, 9:44 PM

## 2017-05-02 NOTE — Progress Notes (Signed)
Janet [name] is a 42 y.o. G1P0 at 8841w1d by ultrasound [facility]  Subjective:  Pt states she is comfortable and has no questions or concerns at this time.  Objective: BP 140/73   Pulse 69   Temp 99.2 F (37.3 C)   Resp 16   Ht 5\' 4"  (1.626 m)   Wt 119.8 kg (264 lb 3.2 oz)   SpO2 100%   BMI 45.35 kg/m  I/O last 3 completed shifts: In: 375 [I.V.:125; IV Piggyback:250] Out: 0  No intake/output data recorded.  FHT:  FHR: 125 bpm, variability: moderate,  accelerations:  Present,  decelerations:  Present occasional variables UC:   regular, every 3 minutes SVE:   Dilation: 6-7 Effacement (%): 80, 90 Station: -1 Exam by:: Natalyia Innes Student CNM  Labs: Lab Results  Component Value Date   WBC 7.8 05/01/2017   HGB 11.4 (L) 05/01/2017   HCT 34.4 (L) 05/01/2017   MCV 81.9 05/01/2017   PLT 153 05/01/2017    Assessment / Plan: Induction of labor due to AMA,  progressing well on pitocin  Labor: Progressing on Pitocin Preeclampsia:  n/a Fetal Wellbeing:  Category I Pain Control:  Epidural I/D:  GBS pos-PCN Anticipated MOD:  NSVD  Rolland Steinert O Spero Gunnels 05/02/2017, 4:29 PM

## 2017-05-02 NOTE — Progress Notes (Signed)
Labor Progress Note Janet [name] is a 42 y.o. G1P0 at 6529w1d presented for IOL for AMA  S: Patient uncomfortable with contractions  O:  BP 133/65   Pulse 62   Temp 98.6 F (37 C) (Oral)   Resp 20   Ht 5\' 4"  (1.626 m)   Wt 264 lb 3.2 oz (119.8 kg)   BMI 45.35 kg/m   UEA:VWUJWJXBFHT:baseline rate 145, moderate variability, no acels, early decels Toco: ctx every 3-4 min  CVE: Dilation: 4 Effacement (%): 50 Cervical Position: Posterior Station: -2 Presentation: Vertex Exam by:: Dr.Phelps  A&P: 42 y.o. G1P0 9729w1d here for IOL for AMA. Labor: Progressing well. FB out. Start pitocin. Pain: Epidural planning FWB: Cat I GBS positive, PCN GHTN: BP 140s range with one nonsustained 163 systolic. Continue to monitor, plan to start labetalol protocol if severe pressures are sustained.  Ellwood DenseAlison Rumball, DO 2:16 AM

## 2017-05-02 NOTE — Progress Notes (Signed)
Labor Progress Note Tedra Senegallvira Alvara-Gonzalez is a 42 y.o. G1P0 at 3536w1d presented for IOL for AMA S: No complaints  O:  BP 127/68   Pulse 65   Temp 98.6 F (37 C) (Oral)   Resp 16   Ht 5\' 4"  (1.626 m)   Wt 119.8 kg (264 lb 3.2 oz)   SpO2 100%   BMI 45.35 kg/m  EFM: 140 bpm/mod var/pos acels/recurrent variable decels  CVE: Dilation: 9 Effacement (%): 90 Cervical Position: Anterior Station: -1 Presentation: Vertex Exam by:: Harrington Student CNM   A&P: 42 y.o. G1P0 6036w1d here for IOL for AMA #Labor: Progressing well. Currently adequate on pitocin. Will trial amnioinfusion. #FWB: cat 2. Amnioinfusion and reposition. Continue to monitor.   Rolm BookbinderAmber Murrel Bertram, DO 7:14 PM

## 2017-05-02 NOTE — Progress Notes (Signed)
Tedra Senegallvira Alvara-Gonzalez is a 42 y.o. G1P0 at 3287w1d by ultrasound [facility]  Subjective:  Pt resting comfortable with epidural. Denies any pain or discomfort.  Objective: BP 127/68   Pulse 65   Temp 98.6 F (37 C) (Oral)   Resp 16   Ht 5\' 4"  (1.626 m)   Wt 119.8 kg (264 lb 3.2 oz)   SpO2 100%   BMI 45.35 kg/m  I/O last 3 completed shifts: In: 375 [I.V.:125; IV Piggyback:250] Out: 700 [Urine:700] No intake/output data recorded.  FHT:  FHR: 130 bpm, variability: moderate,  accelerations:  Present,  decelerations:  Present recurrent variables and late decels UC:   regular, every 3-274minutes SVE:   Dilation: 9 Effacement (%): 90 Station: -1 Exam by:: Malori Myers Student CNM  Labs: Lab Results  Component Value Date   WBC 7.8 05/01/2017   HGB 11.4 (L) 05/01/2017   HCT 34.4 (L) 05/01/2017   MCV 81.9 05/01/2017   PLT 153 05/01/2017    Assessment / Plan: Induction of labor due to AMA,  progressing well on pitocin  Labor: Progressing on Pitocin Preeclampsia:  n/a Fetal Wellbeing:  Category II amnioinfuse 300ml bolus  Pain Control:  Epidural  I/D:  GBS pos-PCN Anticipated MOD:  NSVD  Jhene Westmoreland O Clista Rainford 05/02/2017, 7:10 PM

## 2017-05-02 NOTE — Anesthesia Preprocedure Evaluation (Signed)
Anesthesia Evaluation  Patient identified by MRN, date of birth, ID band Patient awake    Reviewed: Allergy & Precautions, H&P , NPO status , Patient's Chart, lab work & pertinent test results  History of Anesthesia Complications Negative for: history of anesthetic complications  Airway Mallampati: II  TM Distance: >3 FB Neck ROM: full    Dental no notable dental hx. (+) Teeth Intact   Pulmonary neg pulmonary ROS,    Pulmonary exam normal breath sounds clear to auscultation       Cardiovascular negative cardio ROS Normal cardiovascular exam Rhythm:regular Rate:Normal     Neuro/Psych negative neurological ROS  negative psych ROS   GI/Hepatic negative GI ROS, Neg liver ROS,   Endo/Other  Morbid obesity  Renal/GU negative Renal ROS  negative genitourinary   Musculoskeletal   Abdominal (+) + obese,   Peds  Hematology  (+) anemia ,   Anesthesia Other Findings   Reproductive/Obstetrics (+) Pregnancy                             Anesthesia Physical Anesthesia Plan  ASA: III  Anesthesia Plan: Epidural   Post-op Pain Management:    Induction:   PONV Risk Score and Plan:   Airway Management Planned:   Additional Equipment:   Intra-op Plan:   Post-operative Plan:   Informed Consent: I have reviewed the patients History and Physical, chart, labs and discussed the procedure including the risks, benefits and alternatives for the proposed anesthesia with the patient or authorized representative who has indicated his/her understanding and acceptance.     Plan Discussed with:   Anesthesia Plan Comments:         Anesthesia Quick Evaluation  

## 2017-05-02 NOTE — Progress Notes (Signed)
Janet [name] is a 42 y.o. G1P0 at 4421w1d by ultrasound admitted [facility]  Subjective:  Pt states she is comfortable  Objective: BP (!) 129/56   Pulse 60   Temp 98.3 F (36.8 C) (Oral)   Resp 18   Ht 5\' 4"  (1.626 m)   Wt 119.8 kg (264 lb 3.2 oz)   SpO2 100%   BMI 45.35 kg/m  I/O last 3 completed shifts: In: 375 [I.V.:125; IV Piggyback:250] Out: 0  No intake/output data recorded.  FHT:  FHR: 125 bpm, variability: moderate,  accelerations:  Present,  decelerations:  Present Occasional variables and lates UC:   regular, every 2-4 minutes SVE:   Dilation: 5 Effacement (%): 90 Station: -1 Exam by:: Eleonora Peeler Student CNM  Labs: Lab Results  Component Value Date   WBC 7.8 05/01/2017   HGB 11.4 (L) 05/01/2017   HCT 34.4 (L) 05/01/2017   MCV 81.9 05/01/2017   PLT 153 05/01/2017    Assessment / Plan: Induction of labor due to AMA,  progressing well on pitocin  Labor: progressing well on pitocin Preeclampsia:  n/a Fetal Wellbeing:  Category I occasional variables and late deceleration Pain Control:  Epidural I/D:  GBS pos Anticipated MOD:  NSVD  Janet [name] 05/02/2017, 11:25 AM

## 2017-05-03 ENCOUNTER — Encounter (HOSPITAL_COMMUNITY): Payer: Self-pay

## 2017-05-03 ENCOUNTER — Encounter (HOSPITAL_COMMUNITY)
Admission: RE | Disposition: A | Discharge status: Home / Self Care | Payer: Self-pay | Source: Ambulatory Visit | Attending: Obstetrics and Gynecology

## 2017-05-03 DIAGNOSIS — Z3A40 40 weeks gestation of pregnancy: Secondary | ICD-10-CM

## 2017-05-03 DIAGNOSIS — O480 Post-term pregnancy: Secondary | ICD-10-CM

## 2017-05-03 SURGERY — Surgical Case
Anesthesia: Epidural

## 2017-05-03 MED ORDER — SIMETHICONE 80 MG PO CHEW
80.0000 mg | CHEWABLE_TABLET | ORAL | Status: DC
  Administered 2017-05-04: 80 mg via ORAL
  Filled 2017-05-03 (×2): qty 1

## 2017-05-03 MED ORDER — SIMETHICONE 80 MG PO CHEW
80.0000 mg | CHEWABLE_TABLET | Freq: Three times a day (TID) | ORAL | Status: DC
  Administered 2017-05-03 – 2017-05-05 (×7): 80 mg via ORAL
  Filled 2017-05-03 (×6): qty 1

## 2017-05-03 MED ORDER — MORPHINE SULFATE (PF) 0.5 MG/ML IJ SOLN
INTRAMUSCULAR | Status: DC | PRN
  Administered 2017-05-03: 1 mg via INTRAVENOUS
  Administered 2017-05-03: 4 mg via EPIDURAL

## 2017-05-03 MED ORDER — LACTATED RINGERS IV SOLN
INTRAVENOUS | Status: DC | PRN
  Administered 2017-05-03: 03:00:00 via INTRAVENOUS

## 2017-05-03 MED ORDER — NALOXONE HCL 4 MG/10ML IJ SOLN: 1.0000 ug/kg/h | INTRAVENOUS | Status: DC | PRN

## 2017-05-03 MED ORDER — NALBUPHINE HCL 10 MG/ML IJ SOLN: 5.0000 mg | Freq: Once | INTRAMUSCULAR | Status: DC | PRN

## 2017-05-03 MED ORDER — DIBUCAINE 1 % RE OINT: 1.0000 "application " | TOPICAL_OINTMENT | RECTAL | Status: DC | PRN

## 2017-05-03 MED ORDER — COCONUT OIL OIL: 1.0000 "application " | TOPICAL_OIL | Status: DC | PRN

## 2017-05-03 MED ORDER — SCOPOLAMINE 1 MG/3DAYS TD PT72
MEDICATED_PATCH | TRANSDERMAL | Status: DC | PRN
  Administered 2017-05-03: 1 via TRANSDERMAL

## 2017-05-03 MED ORDER — SOD CITRATE-CITRIC ACID 500-334 MG/5ML PO SOLN
30.0000 mL | Freq: Once | ORAL | Status: AC
  Administered 2017-05-03: 30 mL via ORAL
  Filled 2017-05-03: qty 15

## 2017-05-03 MED ORDER — LACTATED RINGERS IV SOLN
INTRAVENOUS | Status: DC | PRN
  Administered 2017-05-03 (×2): via INTRAVENOUS

## 2017-05-03 MED ORDER — KETOROLAC TROMETHAMINE 30 MG/ML IJ SOLN
30.0000 mg | Freq: Four times a day (QID) | INTRAMUSCULAR | Status: AC
  Administered 2017-05-03 (×3): 30 mg via INTRAMUSCULAR
  Filled 2017-05-03 (×3): qty 1

## 2017-05-03 MED ORDER — ACETAMINOPHEN 325 MG PO TABS: 650.0000 mg | ORAL_TABLET | ORAL | Status: DC | PRN

## 2017-05-03 MED ORDER — ENOXAPARIN SODIUM 60 MG/0.6ML ~~LOC~~ SOLN
0.5000 mg/kg | SUBCUTANEOUS | Status: DC
  Administered 2017-05-03 – 2017-05-04 (×2): 60 mg via SUBCUTANEOUS
  Filled 2017-05-03 (×3): qty 0.6

## 2017-05-03 MED ORDER — OXYCODONE-ACETAMINOPHEN 5-325 MG PO TABS: 2.0000 | ORAL_TABLET | ORAL | Status: DC | PRN

## 2017-05-03 MED ORDER — KETOROLAC TROMETHAMINE 30 MG/ML IJ SOLN
INTRAMUSCULAR | Status: DC | PRN
  Administered 2017-05-03: 30 mg via INTRAMUSCULAR

## 2017-05-03 MED ORDER — ONDANSETRON HCL 4 MG/2ML IJ SOLN
INTRAMUSCULAR | Status: AC
  Filled 2017-05-03: qty 2

## 2017-05-03 MED ORDER — SCOPOLAMINE 1 MG/3DAYS TD PT72
MEDICATED_PATCH | TRANSDERMAL | Status: AC
  Filled 2017-05-03: qty 1

## 2017-05-03 MED ORDER — DEXTROSE 5 % IV SOLN
INTRAVENOUS | Status: DC | PRN
  Administered 2017-05-03: 3 g via INTRAVENOUS

## 2017-05-03 MED ORDER — NALOXONE HCL 0.4 MG/ML IJ SOLN: 0.4000 mg | INTRAMUSCULAR | Status: DC | PRN

## 2017-05-03 MED ORDER — DIPHENHYDRAMINE HCL 50 MG/ML IJ SOLN: 12.5000 mg | INTRAMUSCULAR | Status: DC | PRN

## 2017-05-03 MED ORDER — PRENATAL MULTIVITAMIN CH
1.0000 | ORAL_TABLET | Freq: Every day | ORAL | Status: DC
  Administered 2017-05-03 – 2017-05-05 (×3): 1 via ORAL
  Filled 2017-05-03 (×3): qty 1

## 2017-05-03 MED ORDER — OXYCODONE-ACETAMINOPHEN 5-325 MG PO TABS
1.0000 | ORAL_TABLET | ORAL | Status: DC | PRN
  Administered 2017-05-05: 1 via ORAL
  Filled 2017-05-03: qty 1

## 2017-05-03 MED ORDER — SIMETHICONE 80 MG PO CHEW: 80.0000 mg | CHEWABLE_TABLET | ORAL | Status: DC | PRN

## 2017-05-03 MED ORDER — PROMETHAZINE HCL 25 MG/ML IJ SOLN: 6.2500 mg | INTRAMUSCULAR | Status: DC | PRN

## 2017-05-03 MED ORDER — WITCH HAZEL-GLYCERIN EX PADS: 1.0000 "application " | MEDICATED_PAD | CUTANEOUS | Status: DC | PRN

## 2017-05-03 MED ORDER — SENNOSIDES-DOCUSATE SODIUM 8.6-50 MG PO TABS
2.0000 | ORAL_TABLET | ORAL | Status: DC
  Administered 2017-05-03 – 2017-05-04 (×2): 2 via ORAL
  Filled 2017-05-03 (×2): qty 2

## 2017-05-03 MED ORDER — DIPHENHYDRAMINE HCL 25 MG PO CAPS: 25.0000 mg | ORAL_CAPSULE | Freq: Four times a day (QID) | ORAL | Status: DC | PRN

## 2017-05-03 MED ORDER — DEXAMETHASONE SODIUM PHOSPHATE 10 MG/ML IJ SOLN
INTRAMUSCULAR | Status: DC | PRN
  Administered 2017-05-03: 10 mg via INTRAVENOUS

## 2017-05-03 MED ORDER — SODIUM CHLORIDE 0.9 % IR SOLN
Status: DC | PRN
  Administered 2017-05-03: 1000 mL

## 2017-05-03 MED ORDER — MEPERIDINE HCL 25 MG/ML IJ SOLN: 6.2500 mg | INTRAMUSCULAR | Status: DC | PRN

## 2017-05-03 MED ORDER — SCOPOLAMINE 1 MG/3DAYS TD PT72
1.0000 | MEDICATED_PATCH | Freq: Once | TRANSDERMAL | Status: DC
  Administered 2017-05-03: 1.5 mg via TRANSDERMAL
  Filled 2017-05-03: qty 1

## 2017-05-03 MED ORDER — OXYTOCIN 40 UNITS IN LACTATED RINGERS INFUSION - SIMPLE MED: 2.5000 [IU]/h | INTRAVENOUS | Status: AC

## 2017-05-03 MED ORDER — IBUPROFEN 800 MG PO TABS
800.0000 mg | ORAL_TABLET | Freq: Three times a day (TID) | ORAL | Status: DC
  Administered 2017-05-04 – 2017-05-05 (×5): 800 mg via ORAL
  Filled 2017-05-03 (×5): qty 1

## 2017-05-03 MED ORDER — PHENYLEPHRINE HCL 10 MG/ML IJ SOLN
INTRAMUSCULAR | Status: DC | PRN
  Administered 2017-05-03 (×2): 80 ug via INTRAVENOUS

## 2017-05-03 MED ORDER — TETANUS-DIPHTH-ACELL PERTUSSIS 5-2.5-18.5 LF-MCG/0.5 IM SUSP: 0.5000 mL | Freq: Once | INTRAMUSCULAR | Status: DC

## 2017-05-03 MED ORDER — LACTATED RINGERS IV SOLN
INTRAVENOUS | Status: DC
  Administered 2017-05-03: 13:00:00 via INTRAVENOUS

## 2017-05-03 MED ORDER — NALBUPHINE HCL 10 MG/ML IJ SOLN: 5.0000 mg | INTRAMUSCULAR | Status: DC | PRN

## 2017-05-03 MED ORDER — DEXAMETHASONE SODIUM PHOSPHATE 10 MG/ML IJ SOLN
INTRAMUSCULAR | Status: AC
  Filled 2017-05-03: qty 1

## 2017-05-03 MED ORDER — ONDANSETRON HCL 4 MG/2ML IJ SOLN
INTRAMUSCULAR | Status: DC | PRN
  Administered 2017-05-03: 4 mg via INTRAVENOUS

## 2017-05-03 MED ORDER — MENTHOL 3 MG MT LOZG: 1.0000 | LOZENGE | OROMUCOSAL | Status: DC | PRN

## 2017-05-03 MED ORDER — FENTANYL CITRATE (PF) 100 MCG/2ML IJ SOLN: 25.0000 ug | INTRAMUSCULAR | Status: DC | PRN

## 2017-05-03 MED ORDER — ONDANSETRON HCL 4 MG/2ML IJ SOLN: 4.0000 mg | Freq: Three times a day (TID) | INTRAMUSCULAR | Status: DC | PRN

## 2017-05-03 MED ORDER — MORPHINE SULFATE (PF) 0.5 MG/ML IJ SOLN
INTRAMUSCULAR | Status: AC
Start: 2017-05-03 — End: 2017-05-03
  Filled 2017-05-03: qty 10

## 2017-05-03 MED ORDER — SODIUM CHLORIDE 0.9 % IV SOLN
500.0000 mg | Freq: Once | INTRAVENOUS | Status: AC
  Administered 2017-05-03: 500 mg via INTRAVENOUS
  Filled 2017-05-03: qty 500

## 2017-05-03 MED ORDER — OXYTOCIN 10 UNIT/ML IJ SOLN
INTRAVENOUS | Status: DC | PRN
  Administered 2017-05-03: 40 [IU] via INTRAVENOUS

## 2017-05-03 MED ORDER — DIPHENHYDRAMINE HCL 25 MG PO CAPS
25.0000 mg | ORAL_CAPSULE | ORAL | Status: DC | PRN
Start: 2017-05-03 — End: 2017-05-05

## 2017-05-03 MED ORDER — SODIUM CHLORIDE 0.9% FLUSH: 3.0000 mL | INTRAVENOUS | Status: DC | PRN

## 2017-05-03 MED ORDER — OXYTOCIN 10 UNIT/ML IJ SOLN
INTRAMUSCULAR | Status: AC
  Filled 2017-05-03: qty 4

## 2017-05-03 MED ORDER — SODIUM BICARBONATE 8.4 % IV SOLN
INTRAVENOUS | Status: DC | PRN
  Administered 2017-05-03: 10 mL via EPIDURAL

## 2017-05-03 MED ORDER — ZOLPIDEM TARTRATE 5 MG PO TABS: 5.0000 mg | ORAL_TABLET | Freq: Every evening | ORAL | Status: DC | PRN

## 2017-05-03 SURGICAL SUPPLY — 44 items
BENZOIN TINCTURE PRP APPL 2/3 (GAUZE/BANDAGES/DRESSINGS) ×3 IMPLANT
CHLORAPREP W/TINT 26ML (MISCELLANEOUS) ×3 IMPLANT
CLAMP CORD UMBIL (MISCELLANEOUS) ×3 IMPLANT
CLOSURE WOUND 1/2 X4 (GAUZE/BANDAGES/DRESSINGS) ×1
CLOTH BEACON ORANGE TIMEOUT ST (SAFETY) ×3 IMPLANT
DRAPE C SECTION CLR SCREEN (DRAPES) IMPLANT
DRSG OPSITE POSTOP 4X10 (GAUZE/BANDAGES/DRESSINGS) ×3 IMPLANT
ELECT REM PT RETURN 9FT ADLT (ELECTROSURGICAL) ×3
ELECTRODE REM PT RTRN 9FT ADLT (ELECTROSURGICAL) ×1 IMPLANT
EXTRACTOR VACUUM M CUP 4 TUBE (SUCTIONS) IMPLANT
EXTRACTOR VACUUM M CUP 4' TUBE (SUCTIONS)
GLOVE BIO SURGEON STRL SZ7.5 (GLOVE) ×3 IMPLANT
GLOVE BIOGEL PI IND STRL 7.0 (GLOVE) ×5 IMPLANT
GLOVE BIOGEL PI INDICATOR 7.0 (GLOVE) ×10
GOWN STRL REUS W/TWL 2XL LVL3 (GOWN DISPOSABLE) ×3 IMPLANT
GOWN STRL REUS W/TWL LRG LVL3 (GOWN DISPOSABLE) ×6 IMPLANT
HOVERMATT SINGLE USE (MISCELLANEOUS) ×3 IMPLANT
KIT ABG SYR 3ML LUER SLIP (SYRINGE) IMPLANT
NEEDLE HYPO 22GX1.5 SAFETY (NEEDLE) ×3 IMPLANT
NEEDLE HYPO 25X5/8 SAFETYGLIDE (NEEDLE) IMPLANT
NS IRRIG 1000ML POUR BTL (IV SOLUTION) ×3 IMPLANT
PACK C SECTION WH (CUSTOM PROCEDURE TRAY) ×3 IMPLANT
PAD OB MATERNITY 4.3X12.25 (PERSONAL CARE ITEMS) ×3 IMPLANT
PENCIL SMOKE EVAC W/HOLSTER (ELECTROSURGICAL) ×3 IMPLANT
RETRACTOR TRAXI PANNICULUS (MISCELLANEOUS) ×1 IMPLANT
RTRCTR C-SECT PINK 25CM LRG (MISCELLANEOUS) ×3 IMPLANT
SPONGE LAP 18X18 RF (DISPOSABLE) ×9 IMPLANT
STRIP CLOSURE SKIN 1/2X4 (GAUZE/BANDAGES/DRESSINGS) ×2 IMPLANT
SUT CHROMIC 1 CTX 36 (SUTURE) ×6 IMPLANT
SUT VIC AB 1 CT1 27 (SUTURE) ×4
SUT VIC AB 1 CT1 27XBRD ANTBC (SUTURE) ×2 IMPLANT
SUT VIC AB 1 CT1 36 (SUTURE) ×6 IMPLANT
SUT VIC AB 2-0 CT1 (SUTURE) ×3 IMPLANT
SUT VIC AB 2-0 CT1 27 (SUTURE) ×2
SUT VIC AB 2-0 CT1 TAPERPNT 27 (SUTURE) ×1 IMPLANT
SUT VIC AB 3-0 CT1 27 (SUTURE) ×4
SUT VIC AB 3-0 CT1 TAPERPNT 27 (SUTURE) ×2 IMPLANT
SUT VIC AB 3-0 SH 27 (SUTURE)
SUT VIC AB 3-0 SH 27X BRD (SUTURE) IMPLANT
SUT VIC AB 4-0 KS 27 (SUTURE) ×3 IMPLANT
SYR BULB IRRIGATION 50ML (SYRINGE) ×3 IMPLANT
TOWEL OR 17X24 6PK STRL BLUE (TOWEL DISPOSABLE) ×3 IMPLANT
TRAXI PANNICULUS RETRACTOR (MISCELLANEOUS) ×2
TRAY FOLEY BAG SILVER LF 14FR (SET/KITS/TRAYS/PACK) IMPLANT

## 2017-05-03 NOTE — Anesthesia Postprocedure Evaluation (Signed)
Anesthesia Post Note  Patient: Janet [name]  Procedure(s) Performed: CESAREAN SECTION (canceled)     Patient location during evaluation: Mother Baby Anesthesia Type: Spinal Level of consciousness: awake Pain management: satisfactory to patient Vital Signs Assessment: post-procedure vital signs reviewed and stable Respiratory status: spontaneous breathing Cardiovascular status: stable Anesthetic complications: no    Last Vitals:  Vitals:   05/03/17 1200 05/03/17 1220  BP:  119/60  Pulse: 69 78  Resp: 20   Temp: 37.3 C   SpO2: 99%     Last Pain:  Vitals:   05/03/17 1200  TempSrc: Oral  PainSc:    Pain Goal: Patients Stated Pain Goal: 1 (05/03/17 1000)               Cephus ShellingBURGER,Loa Idler

## 2017-05-03 NOTE — Op Note (Signed)
Janet [name] PROCEDURE DATE: 05/03/2017  PREOPERATIVE DIAGNOSES: Intrauterine pregnancy at [redacted]w[redacted]d weeks gestation; failure to progress: arrest of descent, non reassuring fetal status  POSTOPERATIVE DIAGNOSES: The same  PROCEDURE: Primary Low Transverse Cesarean Section  SURGEON, primary: Nettie Elm, MD SURGEON, fellow: Rolm Bookbinder, DO  ANESTHESIOLOGY TEAM: Anesthesiologist: Leonides Grills, MD; Lowella Curb, MD; Heather Roberts, MD CRNA: Jennelle Human, CRNA  INDICATIONS: Janet [name] is a 42 y.o. G1P1001 at [redacted]w[redacted]d here for cesarean section secondary to the indications listed under preoperative diagnoses; please see preoperative note for further details.  The risks of cesarean section were discussed with the patient including but were not limited to: bleeding which may require transfusion or reoperation; infection which may require antibiotics; injury to bowel, bladder, ureters or other surrounding organs; injury to the fetus; need for additional procedures including hysterectomy in the event of a life-threatening hemorrhage; placental abnormalities wth subsequent pregnancies, incisional problems, thromboembolic phenomenon and other postoperative/anesthesia complications.   The patient concurred with the proposed plan, giving informed written consent for the procedure.    FINDINGS:  Viable female infant in cephalic presentation.  Apgars 9 and 9.  Clear amniotic fluid.  Intact placenta, three vessel cord.  Normal uterus, fallopian tubes and ovaries bilaterally.  ANESTHESIA: Epidural  ESTIMATED BLOOD LOSS: 1000 ml SPECIMENS: Placenta sent to pathology COMPLICATIONS: None immediate  PROCEDURE IN DETAIL:  The patient preoperatively received intravenous antibiotics and had sequential compression devices applied to her lower extremities.  She was then taken to the operating room where the epidural anesthesia was dosed up to surgical level and was found to be adequate. She  was then placed in a dorsal supine position with a leftward tilt, and prepped and draped in a sterile manner.  A foley catheter was placed into her bladder and attached to constant gravity.  After an adequate timeout was performed, a Pfannenstiel skin incision was made with scalpel and carried through to the underlying layer of fascia. The fascia was incised in the midline, and this incision was extended bilaterally using the Mayo scissors.  Kocher clamps were applied to the superior aspect of the fascial incision and the underlying rectus muscles were dissected off bluntly.  A similar process was carried out on the inferior aspect of the fascial incision. The rectus muscles were separated in the midline bluntly and the peritoneum was entered bluntly. Attention was turned to the lower uterine segment where a low transverse hysterotomy was made with a scalpel and extended bilaterally bluntly.  The infant was successfully delivered, the cord was clamped and cut after one minute, and the infant was handed over to the awaiting neonatology team. Uterine massage was then administered, and the placenta delivered intact with a three-vessel cord. The uterus was then cleared of clots and debris.  The hysterotomy was closed with 0 Chromic in a running locked fashion, and an imbricating layer was also placed with 0 Chromic.The pelvis was cleared of all clot and debris. Hemostasis was confirmed on all surfaces.  The peritoneum was closed with a 2-0 Vicryl running stitch and the rectus muscles were reapproximated using 3-0 Vicryl interrupted stitches. The fascia was then closed using 0 Vicryl in a running fashion.  The subcutaneous layer was irrigated, then reapproximated with 2-0 plain gut interrupted stitches. The skin was closed with a 4-0 Vicryl Keith needle in subcuticular fashion. The patient tolerated the procedure well. Sponge, lap, instrument and needle counts were correct x 3.  She was taken to the recovery room  in  stable condition.    Rolm BookbinderAmber Lorena Clearman, DO Faculty Practice, [facility]

## 2017-05-03 NOTE — Progress Notes (Signed)
Labor Progress Note Janet [name] is a 42 y.o. G1P0 at 6896w2d presented for IOL for AMA   S: Patient states she is comfortable, starting to feel more pressure in bottom.    O:  BP (!) 146/78   Pulse 61   Temp 99.6 F (37.6 C) (Oral)   Resp 16   Ht 5\' 4"  (1.626 m)   Wt 264 lb 3.2 oz (119.8 kg)   SpO2 95%   BMI 45.35 kg/m  EFM: 140/moderate/+accels with late decels   CVE: Dilation: Lip/rim Effacement (%): 90 Cervical Position: Anterior Station: 0 Presentation: Vertex Exam by:: Dr. Alysia PennaErvin   A&P: 42 y.o. G1P0 596w2d IOL for AMA #Labor: Stalled Progress. Dr Alysia PennaErvin called to bedside to access. #Pain: Epidural #FWB: Cat 2 #GBS positive   Janet [name], CNM 1:51 AM

## 2017-05-03 NOTE — Progress Notes (Signed)
Spanish Interpreter Larey BrickMari [phone] was used to explain procedure and sign consent with Dr. Alysia PennaErvin as he assessed patient and determined the need for a Cesarean section for failure to progress.

## 2017-05-03 NOTE — Progress Notes (Signed)
Patient ID: Janet [name], female   DOB: [birth year], 42 y.o.   MRN: [id number] Pt admitted for IOL d/t AMA. Received cytotec and foley bulb. SROM with foley Pitocin augmentation No cervical change dispite adequate labor for the last 6 hrs. Ant lip, caput and molding No change with trail of pushing. C section recommended to pt. Risk reviewed with pt. Verbalized understanding. Nursing and anesthesia notified Interrupter services used during visit d/t language barrier

## 2017-05-03 NOTE — Transfer of Care (Signed)
Immediate Anesthesia Transfer of Care Note  Patient: Janet SenegalElvira [name]  Procedure(s) Performed: CESAREAN SECTION (N/A )  Patient Location: PACU  Anesthesia Type:Epidural  Level of Consciousness: awake, alert  and oriented  Airway & Oxygen Therapy: Patient Spontanous Breathing  Post-op Assessment: Report given to RN and Post -op Vital signs reviewed and stable  Post vital signs: Reviewed and stable   HR 77 BP 127/65 Sats 99% Resp 18   Last Vitals:  Vitals:   05/03/17 0135 05/03/17 0200  BP:    Pulse:    Resp:    Temp:    SpO2: 95% 96%    Last Pain:  Vitals:   05/02/17 2341  TempSrc: Oral  PainSc:       Patients Stated Pain Goal: 3 (05/02/17 1000)  Complications: No apparent anesthesia complications

## 2017-05-03 NOTE — Anesthesia Postprocedure Evaluation (Signed)
Anesthesia Post Note  Patient: Janet SenegalElvira [name]  Procedure(s) Performed: CESAREAN SECTION (N/A )     Patient location during evaluation: Endoscopy Anesthesia Type: Epidural Level of consciousness: awake and alert Pain management: pain level controlled Vital Signs Assessment: post-procedure vital signs reviewed and stable Respiratory status: spontaneous breathing, nonlabored ventilation, respiratory function stable and patient connected to nasal cannula oxygen Cardiovascular status: blood pressure returned to baseline and stable Postop Assessment: no apparent nausea or vomiting Anesthetic complications: no    Last Vitals:  Vitals:   05/03/17 0516 05/03/17 0616  BP: 130/65 133/80  Pulse: 63 67  Resp: 18 18  Temp: 36.9 C 36.9 C  SpO2: 95% 95%    Last Pain:  Vitals:   05/03/17 0616  TempSrc: Oral  PainSc: 0-No pain   Pain Goal: Patients Stated Pain Goal: 2 (05/03/17 0331)               Heather RobertsSINGER,Mikhia Dusek DANIEL

## 2017-05-03 NOTE — Progress Notes (Signed)
LABOR PROGRESS NOTE  Janet [name] is a 42 y.o. G1P0 at 3834w2d  admitted for IOL for AMA  Subjective: Patient comfortable with epidural   Objective: BP 133/66   Pulse 64   Temp 99.6 F (37.6 C) (Oral)   Resp 16   Ht 5\' 4"  (1.626 m)   Wt 264 lb 3.2 oz (119.8 kg)   SpO2 97%   BMI 45.35 kg/m  or  Vitals:   05/02/17 2247 05/02/17 2300 05/02/17 2330 05/02/17 2341  BP:  (!) 143/67 133/66   Pulse:  67 64   Resp:      Temp:    99.6 F (37.6 C)  TempSrc:    Oral  SpO2: 97% 97%    Weight:      Height:        FSE placed @ 2337  Dilation: Lip/rim Effacement (%): 90 Cervical Position: Anterior Station: 0 Presentation: Vertex Exam by:: Janet [name] FHT: baseline rate 140, moderate varibility, + acel, early and late decel Toco: 2-4  Labs: Lab Results  Component Value Date   WBC 7.8 05/01/2017   HGB 11.4 (L) 05/01/2017   HCT 34.4 (L) 05/01/2017   MCV 81.9 05/01/2017   PLT 153 05/01/2017    Patient Active Problem List   Diagnosis Date Noted  . Advanced maternal age, primigravida in third trimester, antepartum 05/01/2017  . Malpresentation of fetus 04/05/2017    Assessment / Plan: 42 y.o. G1P0 at 1634w2d here for IOL for AMA  Labor: Slow progress, 9.5 since 2115, FSE placed due to decelerations, patient repositioned to right exaggerated sims with peanut.  Fetal Wellbeing:  Cat 2 Pain Control:  Epidural Anticipated MOD:  SVD  Sharyon CableRogers, Yuchen Fedor C, CNM 05/03/2017, 12:07 AM

## 2017-05-04 LAB — CBC
HCT: 25.6 % — ABNORMAL LOW (ref 36.0–46.0)
Hemoglobin: 8.5 g/dL — ABNORMAL LOW (ref 12.0–15.0)
MCH: 27.2 pg (ref 26.0–34.0)
MCHC: 33.2 g/dL (ref 30.0–36.0)
MCV: 82.1 fL (ref 78.0–100.0)
PLATELETS: 144 10*3/uL — AB (ref 150–400)
RBC: 3.12 MIL/uL — AB (ref 3.87–5.11)
RDW: 14.7 % (ref 11.5–15.5)
WBC: 8.4 10*3/uL (ref 4.0–10.5)

## 2017-05-04 MED ORDER — IBUPROFEN 800 MG PO TABS: 800.0000 mg | ORAL_TABLET | Freq: Three times a day (TID) | ORAL | 0 refills | Status: AC

## 2017-05-04 MED ORDER — FERROUS SULFATE 325 (65 FE) MG PO TABS: 325.0000 mg | ORAL_TABLET | Freq: Two times a day (BID) | ORAL | 1 refills | Status: AC

## 2017-05-04 MED ORDER — FERROUS SULFATE 325 (65 FE) MG PO TABS
325.0000 mg | ORAL_TABLET | Freq: Two times a day (BID) | ORAL | Status: DC
  Administered 2017-05-04 – 2017-05-05 (×3): 325 mg via ORAL
  Filled 2017-05-04 (×3): qty 1

## 2017-05-04 NOTE — Progress Notes (Signed)
POSTPARTUM PROGRESS NOTE  Post Partum Day 1 Subjective:  Janet [name] is a 42 y.o. G1P1001 4132w2d s/p pLTCS.  No acute events overnight.  Pt denies problems with ambulating, voiding or po intake. Pain is well controlled. Lochia Small.   Objective: Blood pressure 114/62, pulse 74, temperature 98.5 F (36.9 C), temperature source Oral, resp. rate 18, height 5\' 4"  (1.626 m), weight 113.9 kg (251 lb), SpO2 99 %, unknown if currently breastfeeding.  Physical Exam:  General: alert, cooperative and no distress Lochia:normal flow Chest: no respiratory distress Heart:regular rate, distal pulses intact Abdomen: soft, nontender, honeycomb dressing in place with minimal bleeding Uterine Fundus: firm, appropriately tender DVT Evaluation: No calf swelling or tenderness Extremities: no edema  Recent Labs    05/01/17 1821 05/04/17 0633  HGB 11.4* 8.5*  HCT 34.4* 25.6*    Assessment/Plan:  ASSESSMENT: Janet [name] is a 42 y.o. G1P1001 3332w2d s/p pLTCS POD#1.  Plan to discharge tomorrow MOF: Breast MOC: Nexplanon   LOS: 3 days   Theodis SatoStephanie R Corder3/13/2019, 7:43 AM

## 2017-05-04 NOTE — Discharge Summary (Signed)
OB Discharge Summary     Patient Name: Janet [name] DOB: 1975/11/25 MRN: [id number] Date of admission: 05/01/2017  Delivering MD:    Janet Janet [name] [[id number]][[id number]]      [name], [name] [[id number]][[id number]]  ERVIN, MICHAEL L )  Date of discharge: 05/05/2017    Admitting diagnosis: IOL for advanced maternal age Intrauterine pregnancy: 356w2d    Secondary diagnosis:  Active Problems:   Patient Active Problem List   Diagnosis Date Noted  . Advanced maternal age, primigravida in third trimester, antepartum 05/01/2017  . Malpresentation of fetus 04/05/2017    Additional problems: none     Discharge diagnosis: Term Pregnancy Delivered                                                                                                Post partum procedures:none   Hospital course:  Induction of Labor With Cesarean Section  42 y.o. yo G1P1001 at 1756w2d was admitted to the hospital 05/01/2017 for induction of labor. Patient had a labor course significant for non reassuring fetal heart tones and failure to descend. The patient went for cesarean section due to Arrest of Descent and Non-Reassuring FHR, and delivered a Viable infant,   Janet [name] [[id number]][[id number]]    Janet [name] [[id number]][[id number]]  05/03/2017  Membrane Rupture Time/Date:    Janet Janet [name] [[id number]][[id number]]      Janet [name] [[id number]][[id number]]  12:25 AM ,   Janet Janet [name] [[id number]][[id number]]      Janet [name] [[id number]][[id number]]  05/02/2017   Details of operation can be found in separate operative Note.  Patient had an uncomplicated postpartum course. She is ambulating, tolerating a regular diet, passing flatus, and urinating well.  Patient is discharged home in stable condition on 05/05/17.                                    Physical exam  Vitals:   05/04/17 0618 05/04/17 1753  BP: 114/62 136/78  Pulse: 74 68  Resp: 18 18  Temp: 98.5  F (36.9 C) 98.6 F (37 C)  SpO2:      General: alert, cooperative and no distress Lochia: appropriate Uterine Fundus: firm Incision: Healing well with no significant drainage, No significant erythema, Dressing has old blood but is not saturated DVT Evaluation: No evidence of DVT seen on physical exam.  Labs: Results for orders placed or performed during the hospital encounter of 05/01/17 (from the past 24 hour(s))  CBC     Status: Abnormal   Collection Time: 05/04/17  6:33 AM  Result Value Ref Range   WBC 8.4 4.0 - 10.5 K/uL   RBC 3.12 (L) 3.87 - 5.11 MIL/uL   Hemoglobin 8.5 (L) 12.0 - 15.0 g/dL   HCT 25.425.6 (L) 27.036.0 - 62.346.0 %   MCV 82.1 78.0 - 100.0 fL   MCH 27.2 26.0 - 34.0 pg   MCHC 33.2 30.0 - 36.0 g/dL   RDW 76.214.7 83.111.5 - 51.715.5 %   Platelets 144 (L) 150 - 400 K/uL  Discharge instruction: per After Visit Summary and "Baby and Me Booklet".  After visit meds:  No Known Allergies  Allergies as of 05/05/2017   No Known Allergies     Medication List    TAKE these medications   ferrous sulfate 325 (65 FE) MG tablet Take 1 tablet (325 mg total) by mouth 2 (two) times daily with a meal.   ibuprofen 800 MG tablet Commonly known as:  ADVIL,MOTRIN Take 1 tablet (800 mg total) by mouth every 8 (eight) hours.   PRENATAL PO Take 1 tablet by mouth daily.        Diet: routine diet  Activity: Advance as tolerated. Pelvic rest for 6 weeks.   Outpatient follow up:1 week Future Appointments: No future appointments.  Follow up Appt: No Follow-up on file.     Postpartum contraception: undecided  Newborn Data: APGAR (1 MIN):    Janet Janet [name] [[id number]]    Janet [name] [[id number]]  9   APGAR (5 MINS):    Janet Janet [name] [[id number]]    Janet Janet [name] [[id number]]  9     Baby Feeding: Breast Disposition:home with mother  Rolm Bookbinder, DO  05/05/2017

## 2017-05-04 NOTE — Lactation Note (Signed)
This note was copied from a baby's chart. Lactation Consultation Note Baby 23 hrs old. FOB interpret for mom. Mom stated Lactation has seen her. Mom stated BF going well. Patient Name: Janet [name] ZOXWR'UToday's Date: 05/04/2017     Maternal Data    Feeding Feeding Type: Breast Fed  LATCH Score                   Interventions    Lactation Tools Discussed/Used     Consult Status      [name], Diamond NickelLAURA G 05/04/2017, 1:56 AM

## 2017-05-04 NOTE — Discharge Instructions (Signed)
Parto vaginal, cuidados de puerperio °(Postpartum Care After Vaginal Delivery) °El período de tiempo que sigue inmediatamente al parto se conoce como puerperio. °¿QUÉ TIPO DE ATENCIÓN MÉDICA RECIBIRÉ? °· Podría continuar recibiendo medicamentos y líquidos través de una vía intravenosa (IV) que se colocará en una de sus venas. °· Si se le realizó una incisión cerca de la vagina (episiotomía) o si ha tenido algún desgarro durante el parto, podrían indicarle que se coloque compresas frías sobre la episiotomía o el desgarro. Esto ayuda a aliviar el dolor y la hinchazón. °· Es posible que le den una botella rociadora para que use cuando vaya al baño. Puede utilizarla hasta que se sienta cómoda limpiándose de la manera habitual. Siga los pasos a continuación para usar la botella rociadora: °? Antes de orinar, llene la botella rociadora con agua tibia. No use agua caliente. °? Después de orinar, mientras aún está sentada en el inodoro, use la botella rociadora para enjuagar el área alrededor de la uretra y la abertura vaginal. Con esto podrá limpiar cualquier rastro de orina y sangre. °? Puede hacer esto en lugar de secarse. Cuando comience a sanar, podrá usar la botella rociadora antes de secarse. Asegúrese de secarse suavemente. °? Llene la botella rociadora con agua limpia cada vez que vaya al baño. °· Deberá usar apósitos sanitarios. °¿CÓMO PUEDO SENTIRME? °· Quizás no tenga necesidad de orinar durante varias horas después del parto. °· Sentirá algo de dolor y molestias en el abdomen y la vagina. °· Si está amamantando, podría tener contracciones uterinas cada vez que lo haga. Estas podrían prolongarse hasta varias semanas durante el puerperio. Las contracciones uterinas ayudan al útero a regresar a su tamaño habitual. °· Es normal tener un poco de hemorragia vaginal (loquios) después del parto. La cantidad y apariencia de los loquios a menudo es similar a las del período menstrual la primera semana después del parto.  Disminuirá gradualmente las siguientes semanas hasta convertirse en una descarga seca amarronada o amarillenta. En la mayoría de las mujeres, los loquios se detienen completamente entre 6 a 8 semanas después del parto. Los sangrados vaginales pueden variar de mujer a mujer. °· Los primeros días después del parto, podría padecer congestión mamaria. Los pechos se sentirán pesados, llenos y molestos. Las mamas también podrían latir y ponerse duras, muy tirantes, calientes y sensibles al tacto. Cuando esto ocurra, podría notar leche que se escapa de los senos. El médico puede recomendarle algunos métodos para aliviar este malestar causado por la congestión mamaria. La congestión mamaria debería desaparecer al cabo de unos días. °· Podría sentirse más deprimida o preocupada que lo habitual debido a los cambios hormonales luego del parto. Estos sentimientos no deben durar más de unos pocos días. Si no desaparecen al cabo de algunos días, hable con su médico. °¿QUÉ CUIDADOS DEBO TENER? °· Infórmele a su médico si siente dolor o malestar. °· Beba suficiente agua para mantener la orina clara o de color amarillo pálido. °· Lávese bien las manos con agua y jabón durante al menos 20 segundos después de cambiar el apósito sanitario, usar el baño o antes de sostener o alimentar al bebé. °· Si no está amamantando, evite tocarse mucho los senos. Al hacerlo, podrían producir más leche. °· Si se siente débil o mareada, o si siente que está a punto de desmayarse, pida ayuda antes de realizar lo siguiente: °? Levantarse de la cama. °? Ducharse. °· Cambie los apósitos sanitarios con frecuencia. Observe si hay cambios en el flujo, como un aumento repentino en el   volumen, cambios en el color o coágulos sanguíneos de gran tamaño. Si expulsa un coágulo sanguíneo por la vagina, guárdelo para mostrárselo a su médico. No tire la cadena sin que el médico examine el coágulo antes. °· Asegúrese de tener todas las vacunas al día. Esto la ayudará a  estar protegida y a proteger al bebé de determinadas enfermedades. Podría necesitar vacunas antes de dejar el hospital. °· Si lo desea, hable con el médico acerca de los métodos de planificación familiar o control de la natalidad (métodos anticonceptivos). °¿CÓMO PUEDO ESTABLECER LAZOS CON MI BEBÉ? °Pasar tanto tiempo como le sea posible con el bebé es sumamente importante. Durante ese tiempo, usted y su bebé pueden conocerse y desarrollar lazos. Tener al bebé con usted en la habitación le dará tiempo de conocerlo. Esto también puede hacerla sentir más cómoda para atender al bebé. Amamantar también puede ayudarla a crear lazos con el bebé. °¿CÓMO PUEDO PLANIFICAR MI REGRESO A CASA CON EL BEBÉ? °· Asegúrese de tener instalada una butaca en el automóvil. °? La butaca debe contar con la certificación del fabricante para asegurarse de que esté instalada en forma segura. °? Asegúrese de que el bebé quede bien asegurado en la butaca. °· Pregúntele al médico todo lo que necesite saber sobre los cuidados de su bebé. Asegúrese de poder comunicarse con el médico en caso de que tenga preguntas luego de dejar el hospital. °Esta información no tiene como fin reemplazar el consejo del médico. Asegúrese de hacerle al médico cualquier pregunta que tenga. °Document Released: 12/06/2006 Document Revised: 06/02/2015 Document Reviewed: 01/13/2015 °Elsevier Interactive Patient Education © 2018 Else[employer] Inc[employer] ° °

## 2017-05-04 NOTE — Lactation Note (Signed)
This note was copied from a baby's chart. Follow up with mom of 37 hour old infant. Spoke with mom and dad with assistance of [facility], [place].   Mom reports she feels BF is going well. Mom reports increased breast fullness today and denies nipple pain/tenderness.   Mom has DEBP set up and reports she is using it for stimulation. Dried Colostrum was noted in flanges. Enc mom to put colostrum on finger and put in infant mouth and that all colostrum expressed should be fed to infant. Mom voiced understanding. Mom was using # 27 flanges, asked her to change to # 24 flanges for better fit.   Parents report they were not shown how to wash pump parts. Pump disassembled and washed. Enc washing post every pumping. Mom is aware to pump post BF and offer all EBM to infant.   Infant was awake and ready to feed. Mom latched infant to the left breast in a cradle hold. Mom used good pillow support. Infant latched and was noted to be deeply latched, infant fed actively for 10 minutes with a lot of swallows and then fell asleep. Enc mom to feed infant STS and to keep awake during feeding as needed. Enc mom to massage/compress breast with feeding to maximize milk transfer.   Mom reports she has no questions/concerns at this time. Mom to call out for feeding assistance as needed.

## 2017-05-05 NOTE — Lactation Note (Signed)
This note was copied from a baby's chart. Lactation Consultation Note  Patient Name: Janet [name] ZOXWR'UToday's Date: 05/05/2017 Reason for consult: Other (Comment);Follow-up assessment;1st time breastfeeding(Interpreter Susanne BordersSylvia Sobaivarro (Spanish Interpreter))  LC visit prior to discharge  Mother has been exclusively breastfeeding infant since birth.  Infant acting hungry so LC offered to assist/watch mother latch.  Mother feeding infant in the cross cradle position .  Showed mother how to compress breast to get a deeper latch and also reviewed how to position hand for easy breast compressions during feeds.  Mother stated she felt a difference in the latch after repositioning.  Reviewed breast engorgement prevention and treatment.  Mom made aware of O/P services, breastfeeding support groups, community resources, and our phone # for post-discharge questions. WIC representative here and will sign mother up before discharge.  RN updated. Maternal Data Does the patient have breastfeeding experience prior to this delivery?: No  Feeding Feeding Type: Breast Fed Length of feed: 12 min(Feeding now)  LATCH Score Latch: Grasps breast easily, tongue down, lips flanged, rhythmical sucking.  Audible Swallowing: Spontaneous and intermittent  Type of Nipple: Everted at rest and after stimulation  Comfort (Breast/Nipple): Soft / non-tender  Hold (Positioning): Assistance needed to correctly position infant at breast and maintain latch.  LATCH Score: 9  Interventions Interventions: Breast feeding basics reviewed;Assisted with latch;Skin to skin;Adjust position;Breast massage;Hand pump  Lactation Tools Discussed/Used WIC Program: Yes(WIC reps here to sign patient up now)   Consult Status Consult Status: Complete Date: 05/05/17    Irene PapBeth R Yolanda Dockendorf 05/05/2017, 1:38 PM

## 2017-06-16 ENCOUNTER — Other Ambulatory Visit: Payer: Self-pay | Admitting: Obstetrics & Gynecology

## 2017-06-16 DIAGNOSIS — Z1231 Encounter for screening mammogram for malignant neoplasm of breast: Secondary | ICD-10-CM

## 2019-07-05 IMAGING — US US MFM OB COMP +14 WKS
1 series · 14 of 28 positions shown · non-contrast
Comparison: none

[Series 1: us mfm ob comp +14 wks · 14 of 35 slices shown]
[im 2/35]
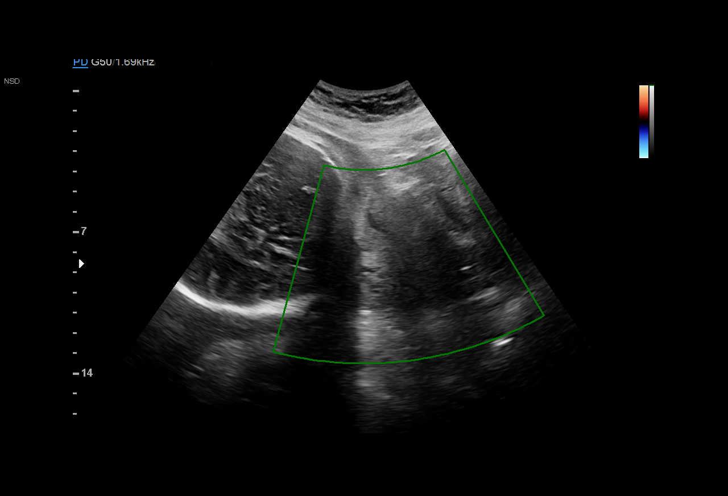
[im 4/35]
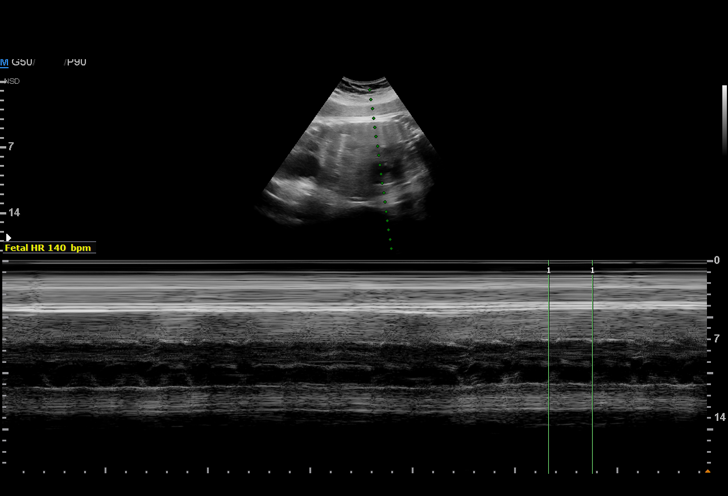
[im 7/35]
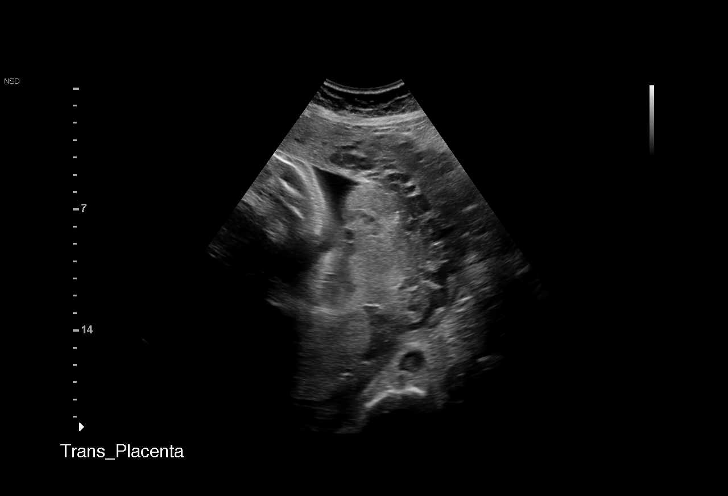
[im 9/35]
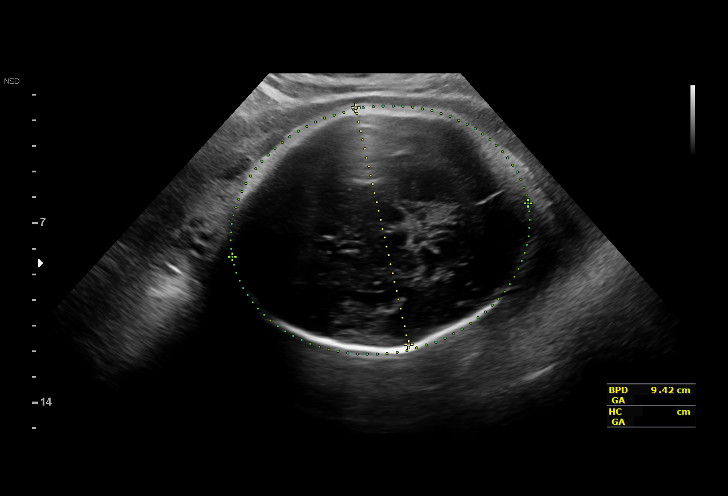
[im 12/35]
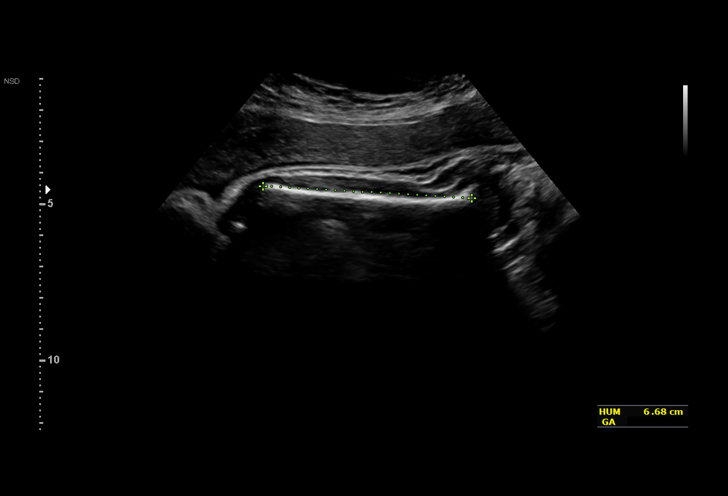
[im 14/35]
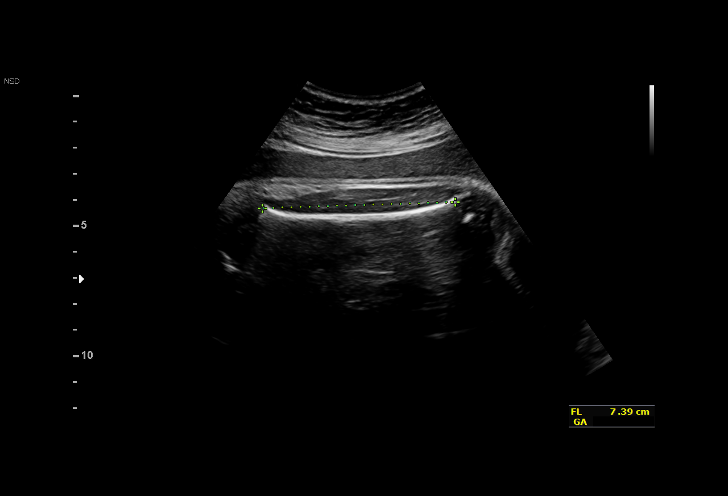
[im 17/35]
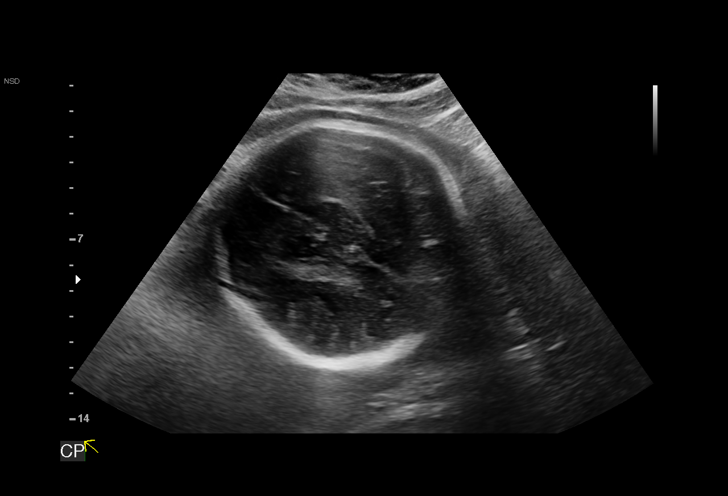
[im 19/35]
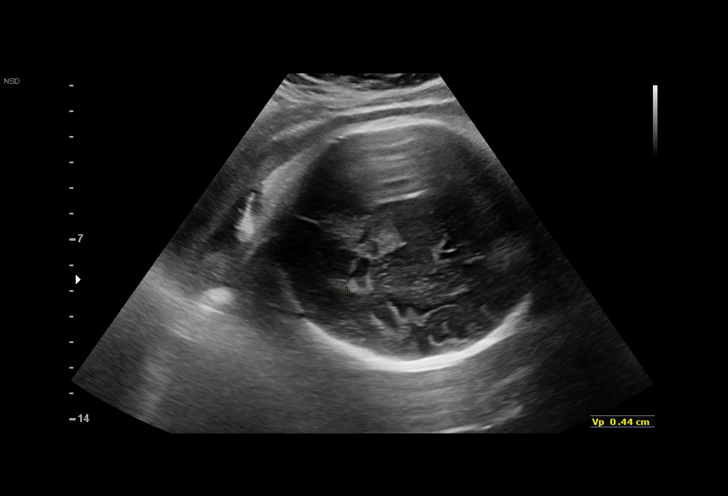
[im 22/35]
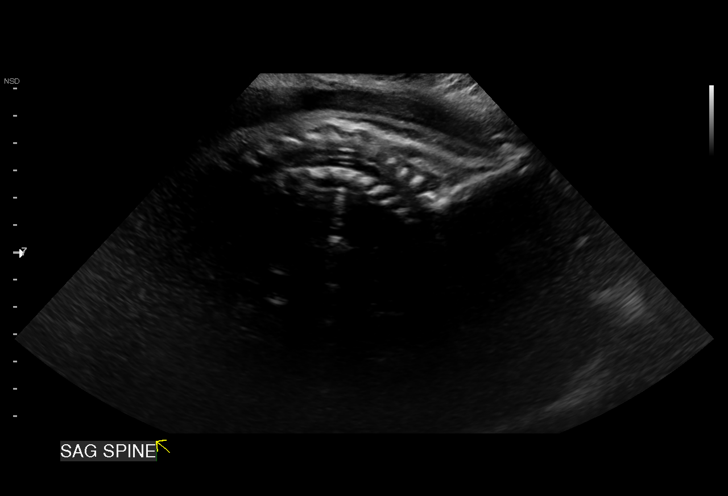
[im 24/35]
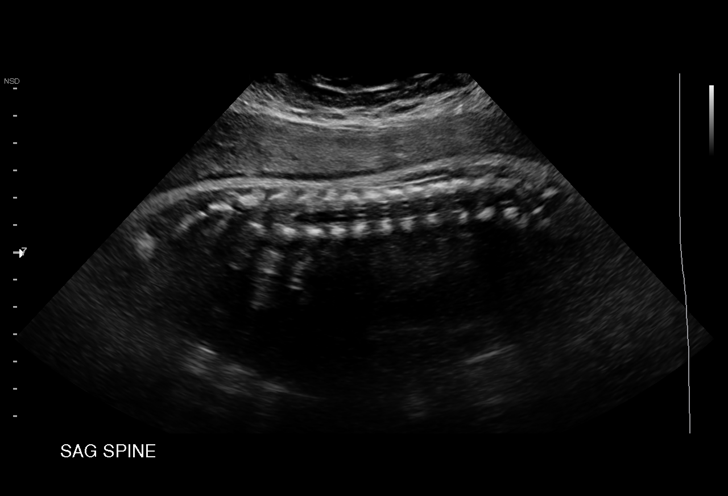
[im 27/35]
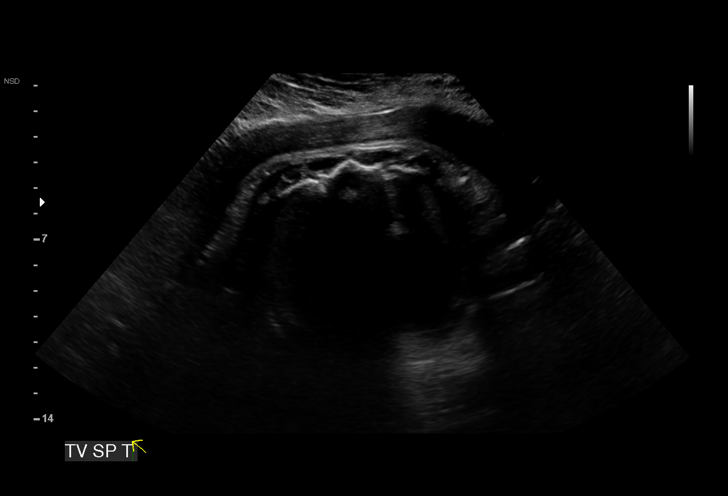
[im 29/35]
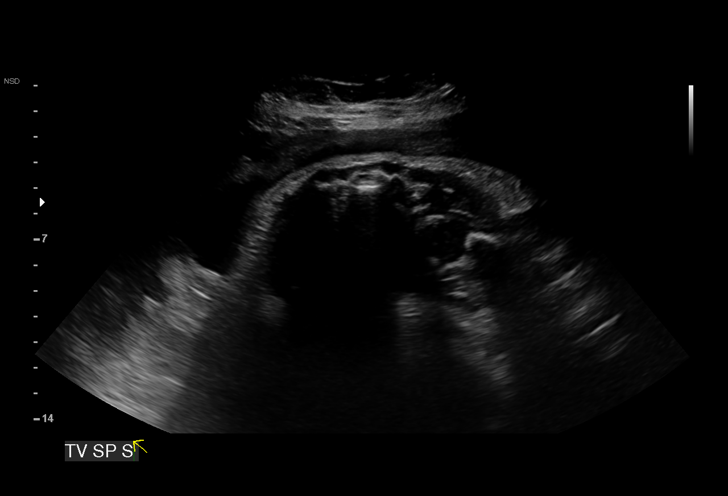
[im 32/35]
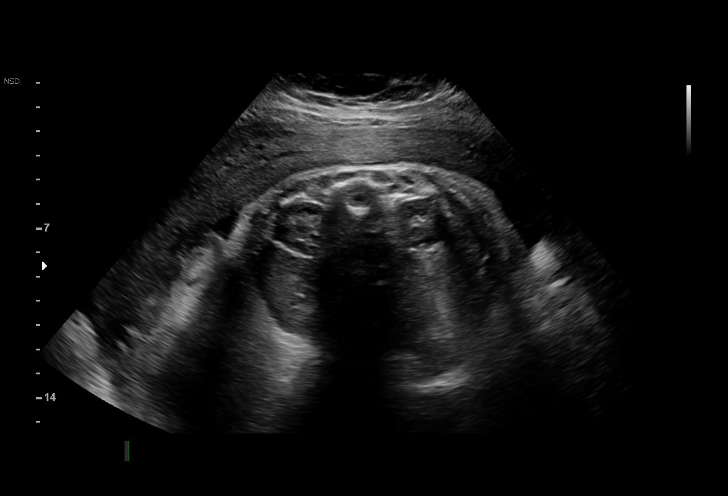
[im 35/35]
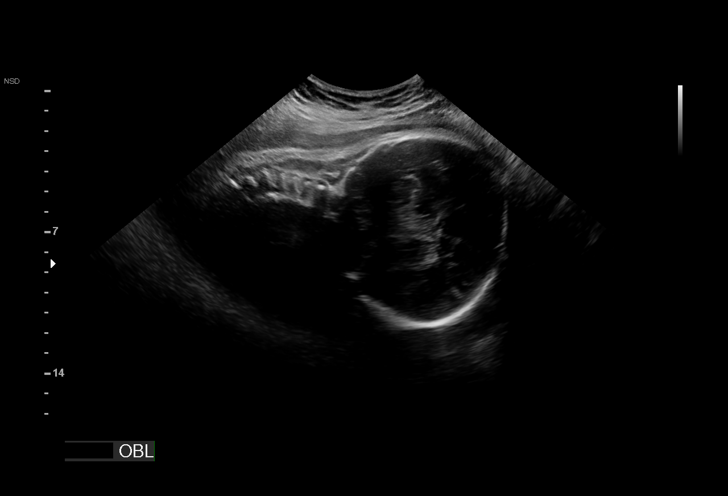

[14 of 28 positions shown; findings below may reference images not displayed]

[REDACTED]
[REDACTED]7

1  FAHIEM             [id number]      [id number]     [id number]
Indications

40 weeks gestation of pregnancy
Encounter for other specified antenatal
screening (EFW)
Determine Fetal presentation by ultrasound
OB History

Gravidity:    1         Term:   0        Prem:   0        SAB:   0
TOP:          0       Ectopic:  0        Living: 0
Fetal Evaluation

Num Of Fetuses:     1
Fetal Heart         140
Rate(bpm):
Cardiac Activity:   Observed
Presentation:       Oblique Cephalic
Placenta:           Posterior, above cervical os
P. Cord Insertion:  Not well visualized

Amniotic Fluid
AFI FV:      Subjectively within normal limits

AFI Sum(cm)     %Tile       Largest Pocket(cm)
11.82           46

RUQ(cm)       RLQ(cm)       LUQ(cm)        LLQ(cm)
2.13          0
Biometry

BPD:      94.2  mm     G. Age:  38w 3d         51  %    CI:        77.63   %    70 - 86
FL/HC:      22.1   %    20.7 -
HC:      338.4  mm     G. Age:  38w 6d         23  %    HC/AC:      0.97        0.87 -
AC:      349.7  mm     G. Age:  38w 6d         46  %    FL/BPD:     79.3   %    71 - 87
FL:       74.7  mm     G. Age:  38w 2d         20  %    FL/AC:      21.4   %    20 - 24
HUM:      66.5  mm     G. Age:  38w 4d         56  %

Est. FW:    5889  gm    7 lb 14 oz      73  %
Gestational Age

U/S Today:     38w 4d                                        EDD:   05/11/17
Best:          40w 0d     Det. By:  Early Ultrasound         EDD:   05/01/17
(12/16/16)
Anatomy

Cranium:               Appears normal         Aortic Arch:            Not well visualized
Cavum:                 Not well visualized    Ductal Arch:            Not well visualized
Ventricles:            Appears normal         Diaphragm:              Not well visualized
Choroid Plexus:        Appears normal         Stomach:                Appears normal, left
sided
Cerebellum:            Not well visualized    Abdomen:                Appears normal
Posterior Fossa:       Not well visualized    Abdominal Wall:         Not well visualized
Nuchal Fold:           Not applicable (>20    Cord Vessels:           Not well visualized
wks GA)
Face:                  Not well visualized    Kidneys:                Appear normal
Lips:                  Not well visualized    Bladder:                Appears normal
Heart:                 Not well visualized    Spine:                  Limited views
appear normal
RVOT:                  Not well visualized    Upper Extremities:      Not well visualized
LVOT:                  Not well visualized    Lower Extremities:      Not well visualized

Other:  Complete fetal anatomic survey previously performed in office.
Technicallly difficult due to advanced GA and maternal habitus.
Cervix Uterus Adnexa

Cervix
Not visualized (advanced GA >73wks)
Impression

SIUP at 40+0 weeks with cardiac activity
Cephalic presentation
Normal but very limited detailed fetal anatomy; no gross
abnormalities identified
Normal amniotic fluid volume
Measurements consistent with prior US; EFW at the 73rd
%tile
Recommendations

Follow-up ultrasounds as clinically indicated.
